# Patient Record
Sex: Male | Born: 1980 | Race: White | Hispanic: No | Marital: Single | Smoking: Current every day smoker
Health system: Southern US, Community
[De-identification: ages and names within clinical notes are randomized; demographics above are authoritative.]

## PROBLEM LIST (undated history)

## (undated) DIAGNOSIS — I10 Essential (primary) hypertension: Secondary | ICD-10-CM

## (undated) HISTORY — DX: Essential (primary) hypertension: I10

---
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
## 2003-11-06 ENCOUNTER — Other Ambulatory Visit: Payer: Self-pay

## 2005-02-07 ENCOUNTER — Emergency Department: Payer: Self-pay | Admitting: Emergency Medicine

## 2008-06-23 ENCOUNTER — Emergency Department: Payer: Self-pay | Admitting: Emergency Medicine

## 2009-07-01 ENCOUNTER — Emergency Department: Payer: Self-pay | Admitting: Emergency Medicine

## 2010-01-20 ENCOUNTER — Emergency Department: Payer: Self-pay | Admitting: Unknown Physician Specialty

## 2010-01-26 ENCOUNTER — Emergency Department: Payer: Self-pay | Admitting: Emergency Medicine

## 2010-02-18 ENCOUNTER — Emergency Department: Payer: Self-pay | Admitting: Emergency Medicine

## 2011-03-22 IMAGING — CT CT ABD-PELV W/O CM
1 of 2 series · 15 of 32 positions shown, 19 images · non-contrast
Comparison: none

REASON FOR EXAM: (1) left flank pain; (2) left flank pain
COMMENTS:   May transport without cardiac monitor

[Series 2: stone · axial · 0.93mm/px · z∈[-978,-510]mm · 15 of 170 slices shown, 19 images]
[im 7/170  soft-tissue]
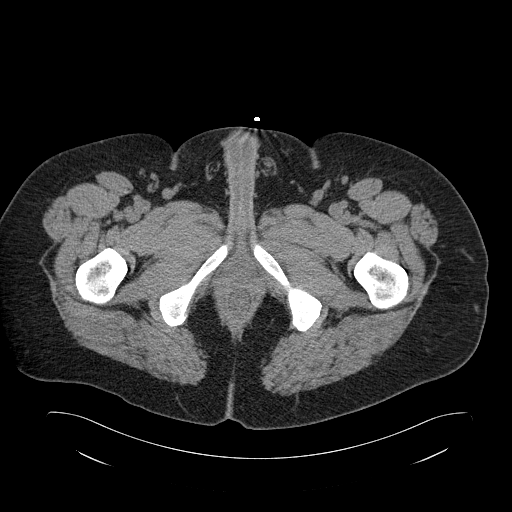
[im 7/170  bone]
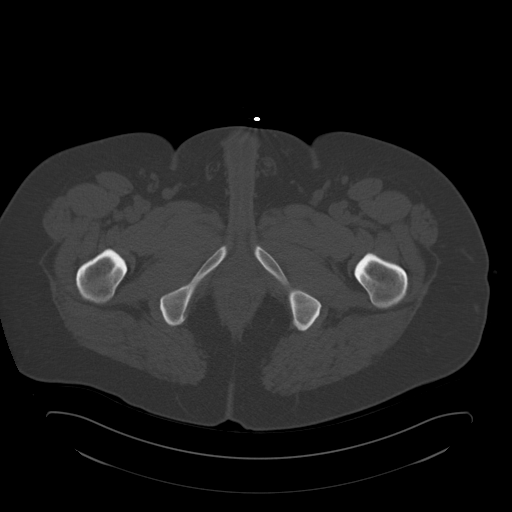
[im 21/170  soft-tissue]
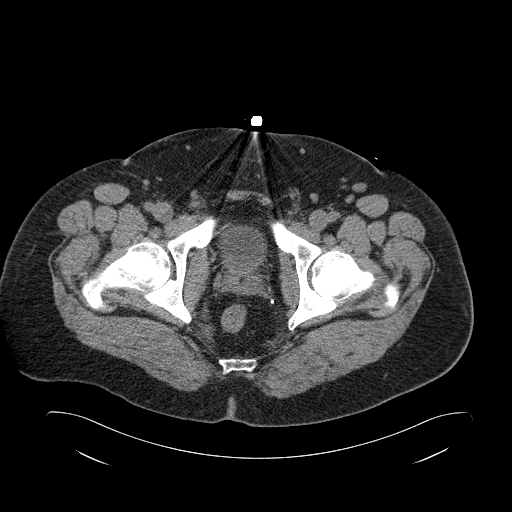
[im 34/170  soft-tissue]
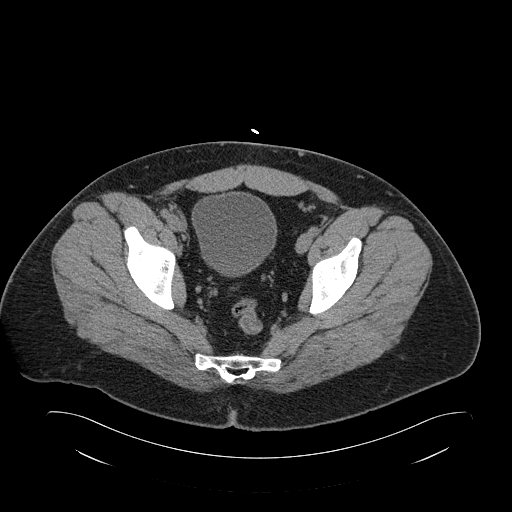
[im 48/170  soft-tissue]
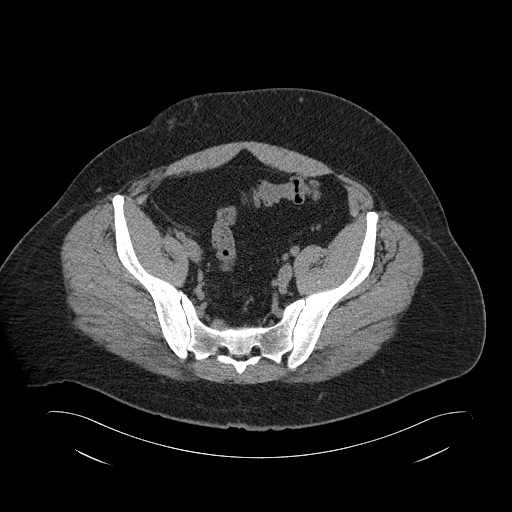
[im 61/170  soft-tissue]
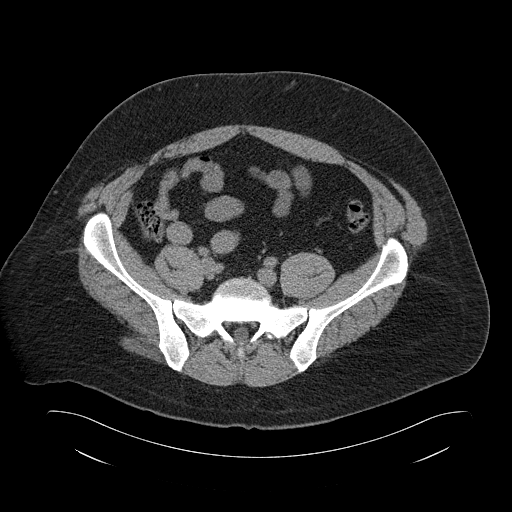
[im 75/170  soft-tissue]
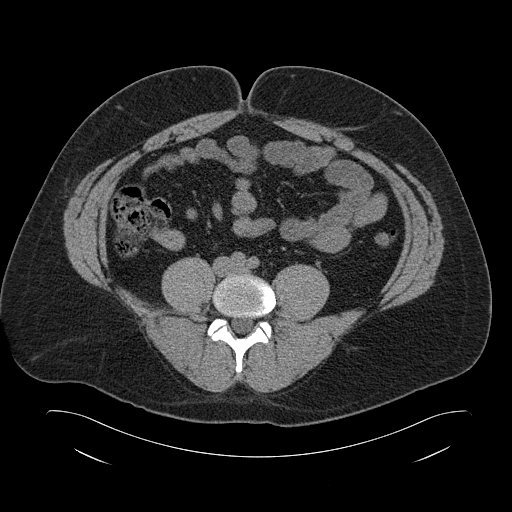
[im 88/170  soft-tissue]
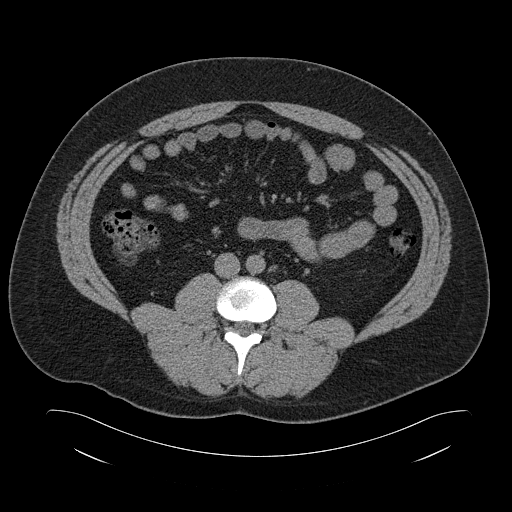
[im 95/170  soft-tissue]
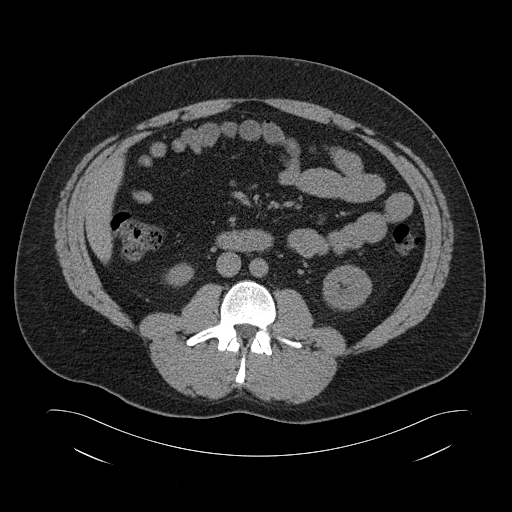
[im 109/170  soft-tissue]
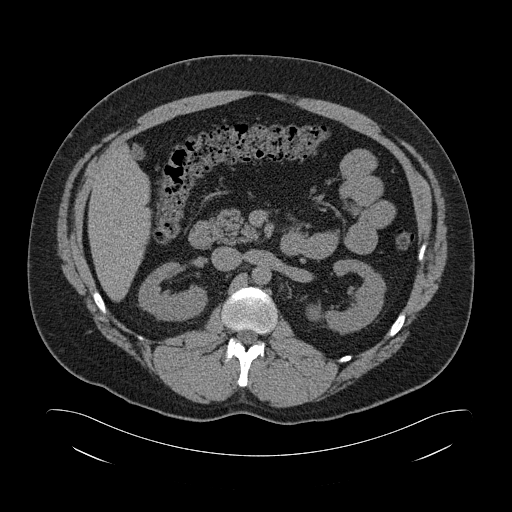
[im 109/170  bone]
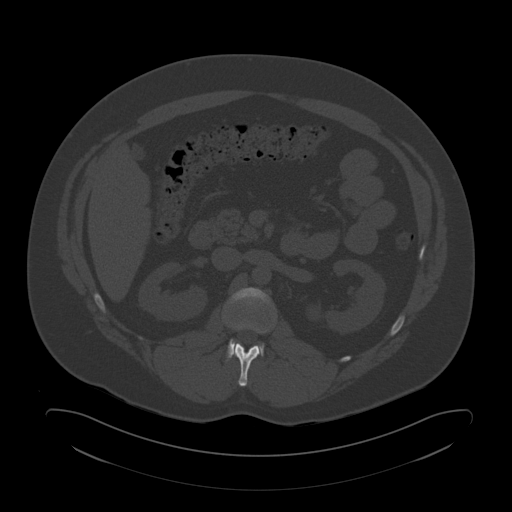
[im 122/170  soft-tissue]
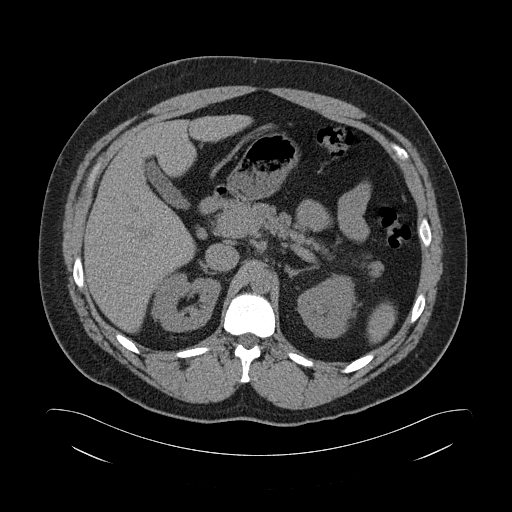
[im 136/170  soft-tissue]
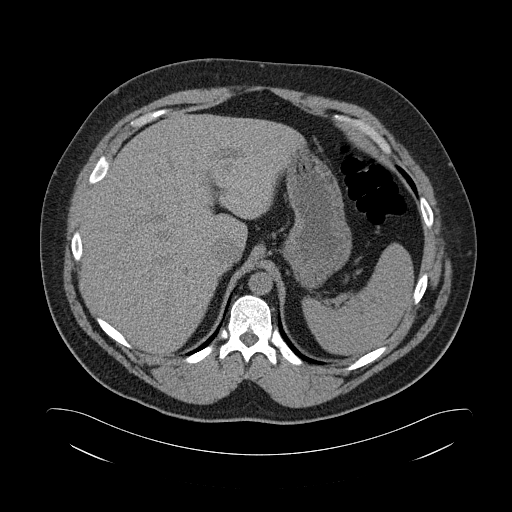
[im 142/170  lung]
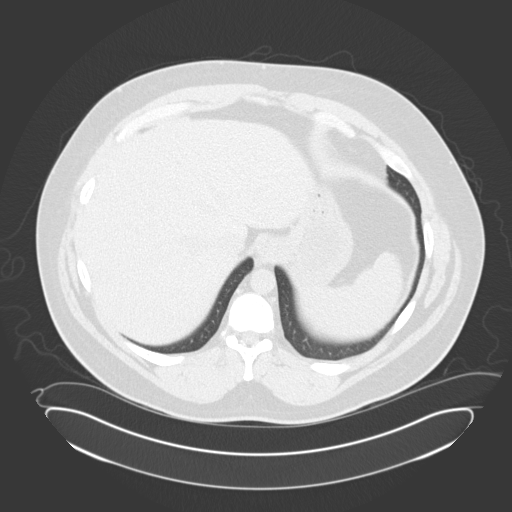
[im 149/170  soft-tissue]
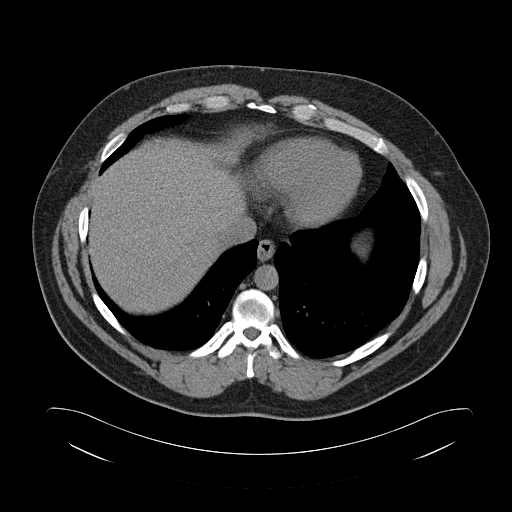
[im 149/170  lung]
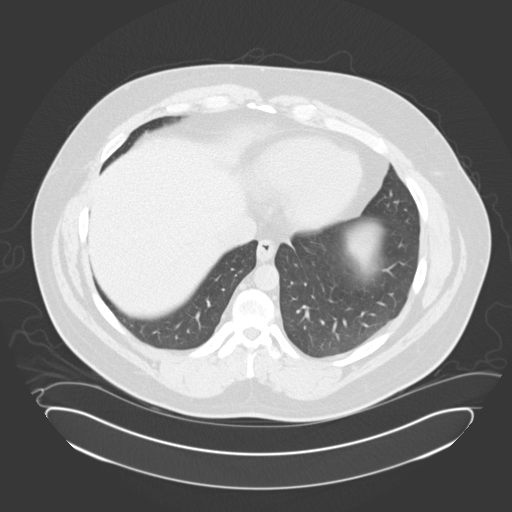
[im 156/170  lung]
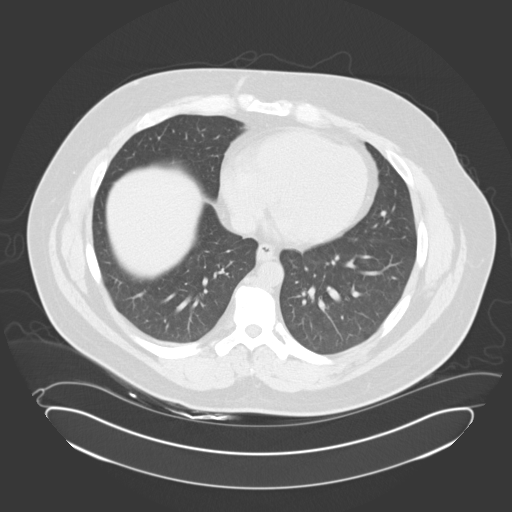
[im 163/170  soft-tissue]
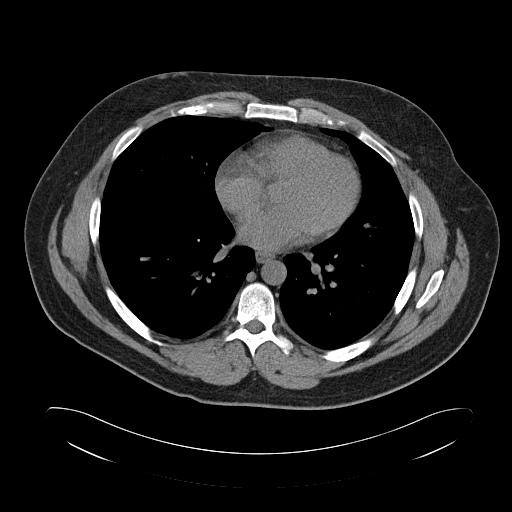
[im 163/170  lung]
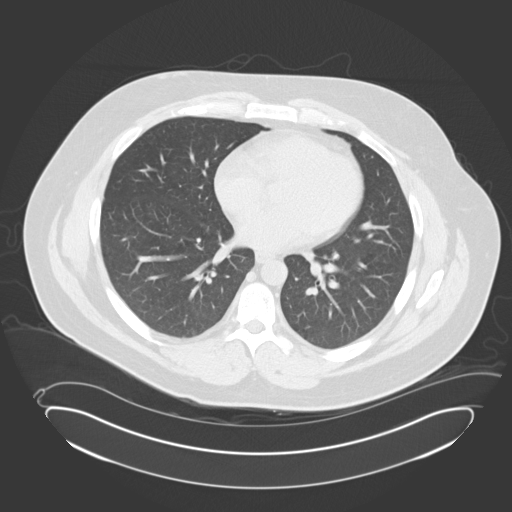

[15 of 32 positions shown; findings below may reference images not displayed]

PROCEDURE:     CT  - CT ABDOMEN AND PELVIS W[DATE]  [DATE]

RESULT:     Axial noncontrast CT scanning was performed through the abdomen
and pelvis at 3 mm intervals and slice thicknesses. Review of multiplanar
reconstructed images was performed separately on the VIA monitor.

The kidneys exhibit normal density and contour with no evidence of stones
nor of obstruction. The perinephric fat is normal in density. The liver,
gallbladder, pancreas, spleen, partially distended stomach, and adrenal
glands are normal in appearance. The caliber of the abdominal aorta is
normal.

The unopacified loops of small and large bowel exhibit no evidence of ileus
or obstruction or acute inflammation. The partially distended urinary
bladder is normal in appearance. I see no inguinal nor umbilical hernia. The
lung bases are clear. The lumbar vertebral bodies are preserved in height.
IMPRESSION: 1. I do not see evidence of urinary tract stones or urinary tract
obstruction or other acute abnormality of the kidneys.
2. I do not see evidence of bowel obstruction or ileus or inflammation.
3. There is no evidence of acute hepatobiliary abnormality.
4. I do not see evidence of inguinal nor umbilical hernia is.

A preliminary report was sent to the [HOSPITAL] the conclusion
of the study.

## 2011-04-20 IMAGING — US ABDOMEN ULTRASOUND
1 series · 17 of 25 positions shown · non-contrast
Comparison: none

REASON FOR EXAM: LUQ abd pain, nausea
COMMENTS:

[Series 1: abdomen ultrasound · 17 of 81 slices shown]
[im 1/81]
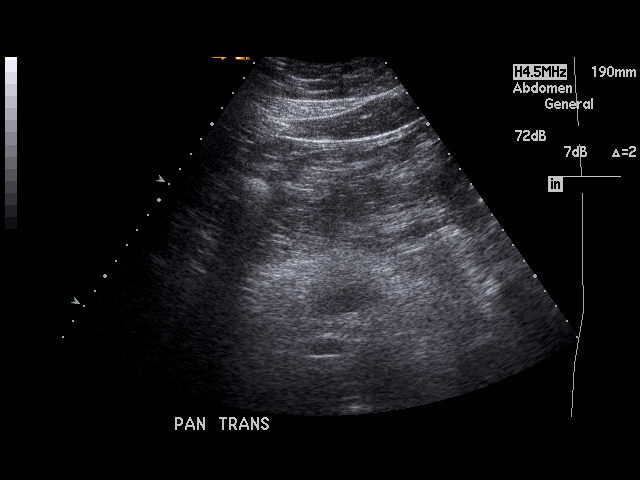
[im 7/81]
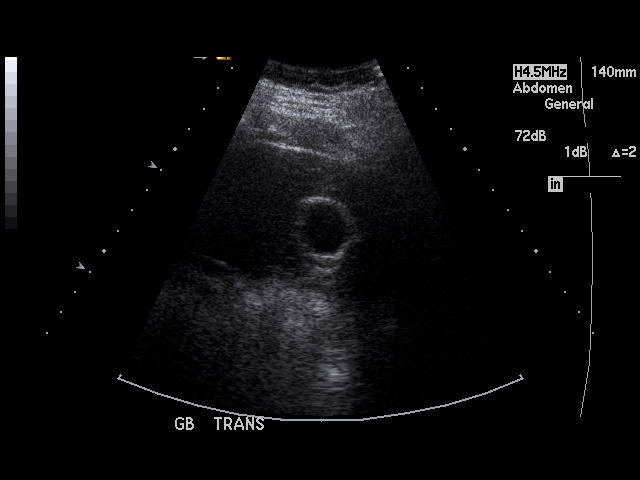
[im 11/81]
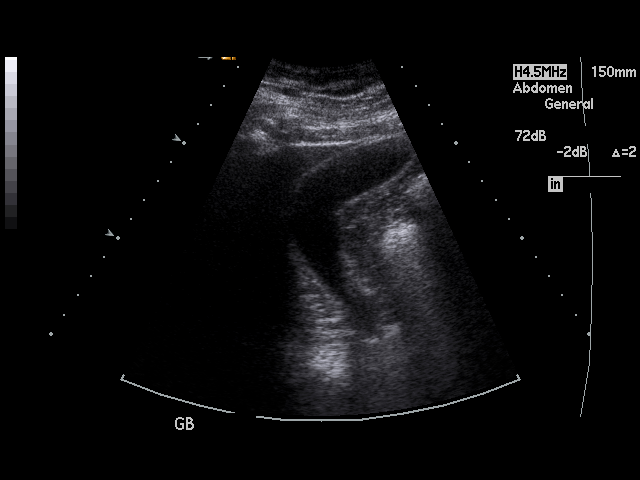
[im 17/81]
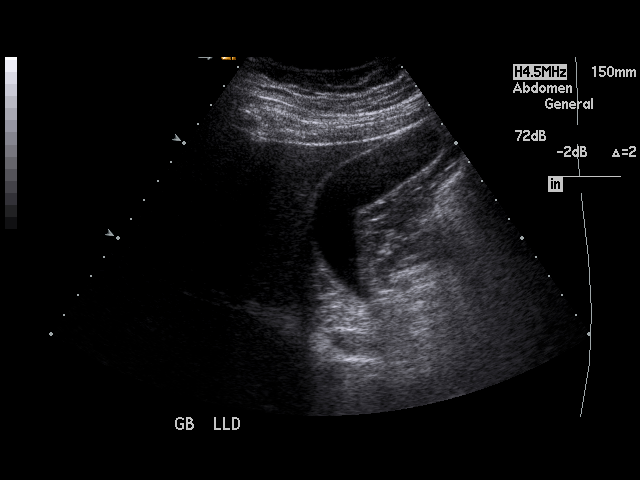
[im 21/81]
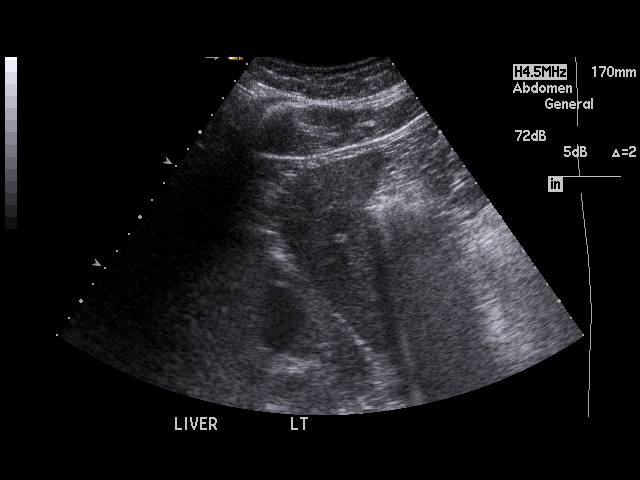
[im 27/81]
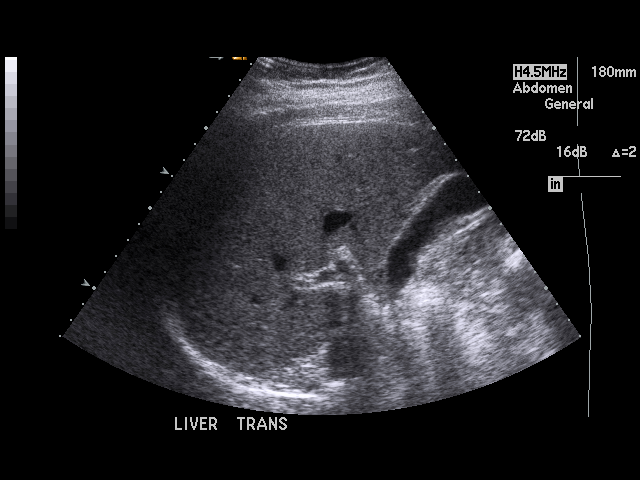
[im 31/81]
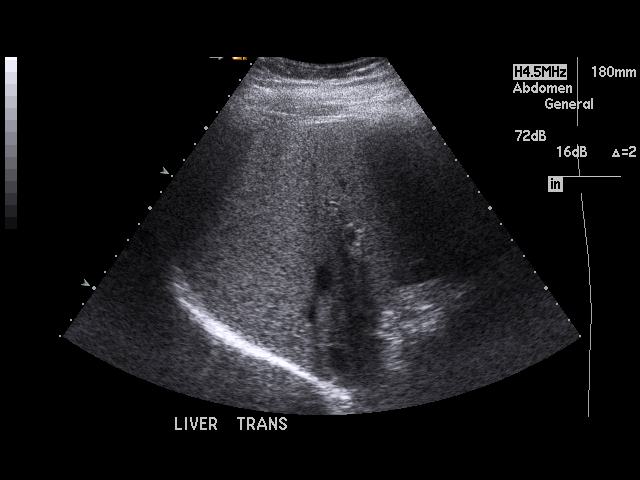
[im 37/81]
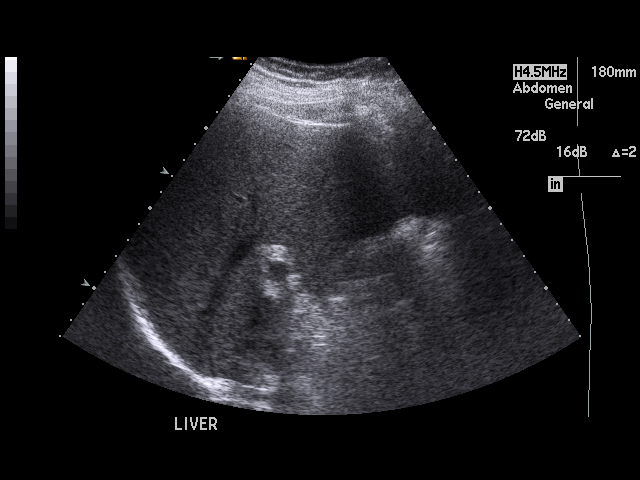
[im 41/81]
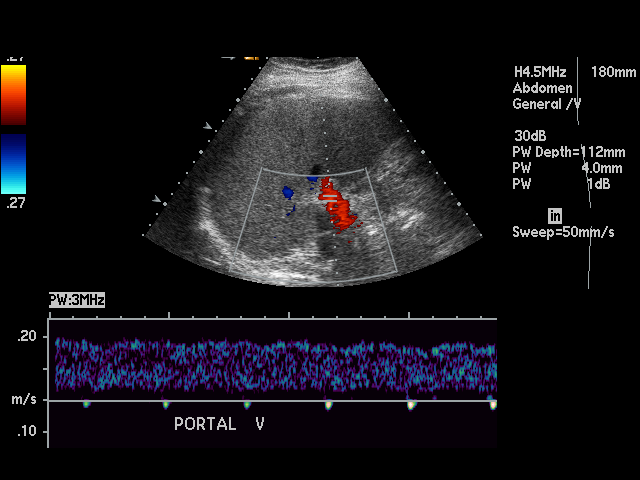
[im 44/81]
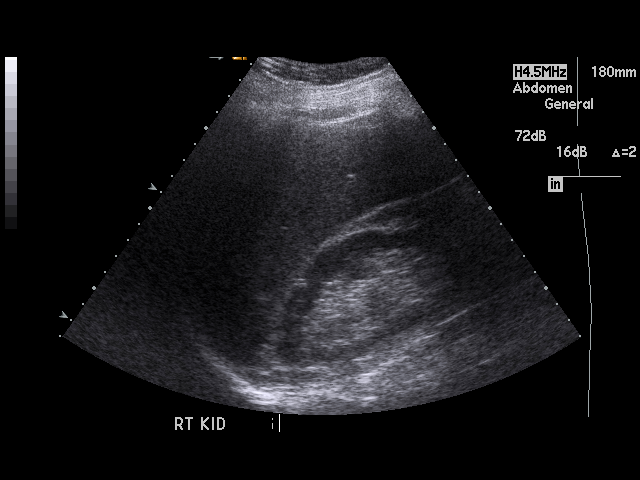
[im 51/81]
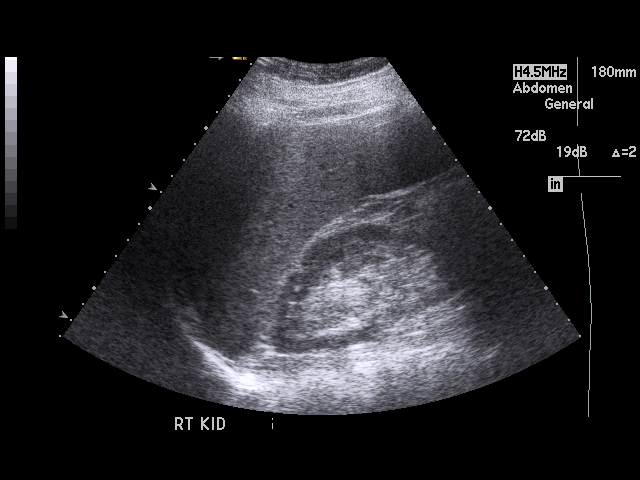
[im 54/81]
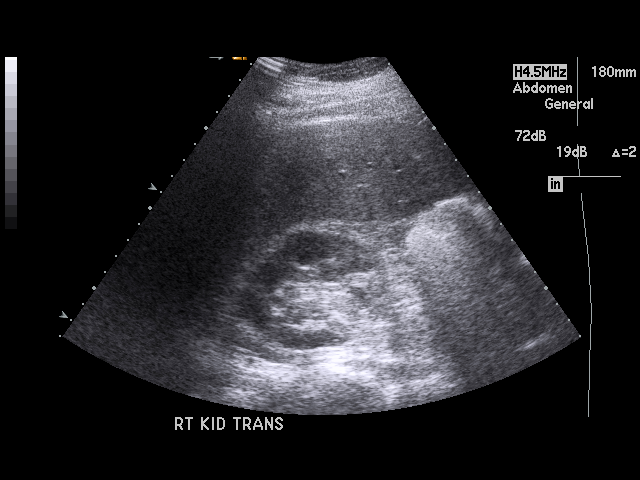
[im 61/81]
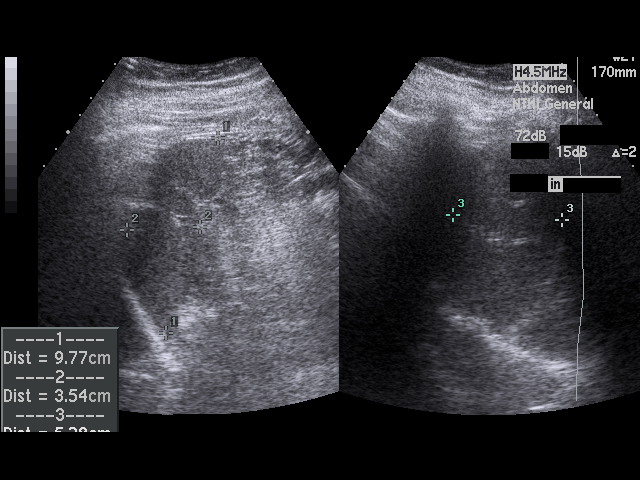
[im 64/81]
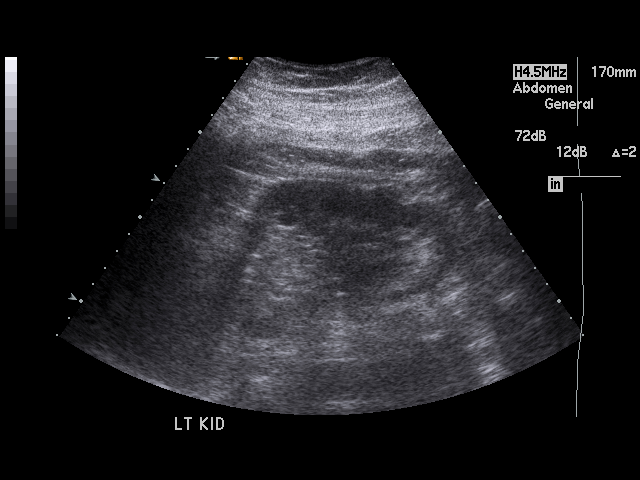
[im 71/81]
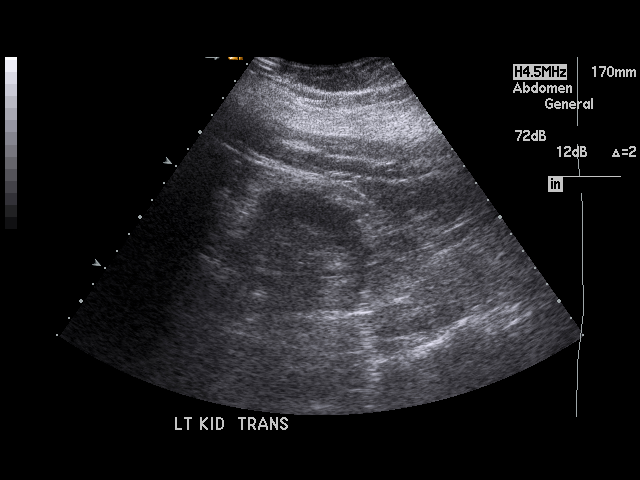
[im 74/81]
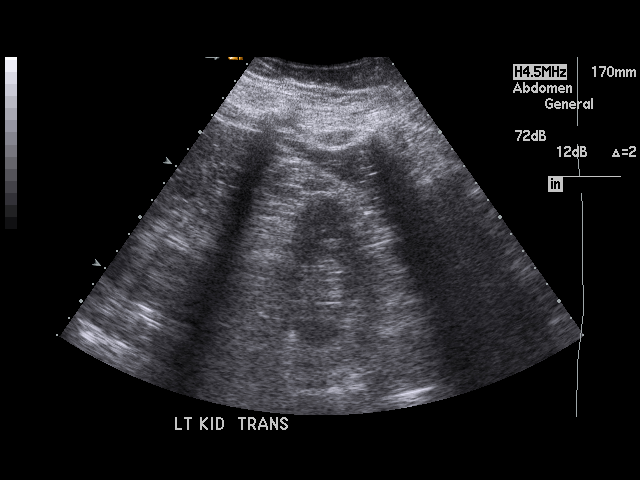
[im 81/81]
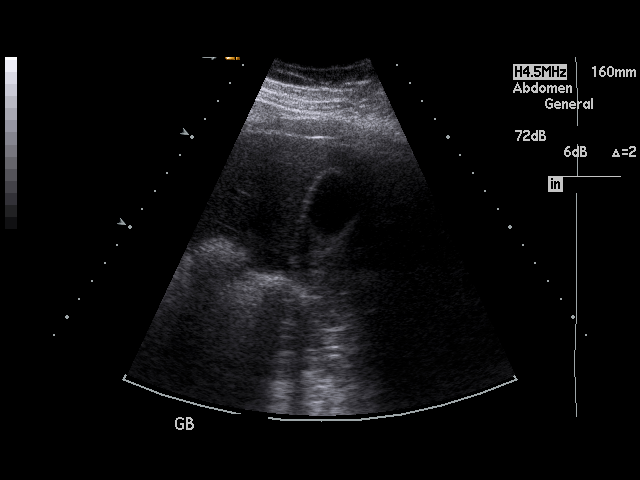

[17 of 25 positions shown; findings below may reference images not displayed]

PROCEDURE:     US  - US ABDOMEN GENERAL SURVEY  - February 18, 2010  [DATE]

RESULT:     The liver, spleen, abdominal aorta and inferior vena cava show
no significant abnormalities. The pancreas is not visualized adequately for
evaluation on this exam. No gallstones are seen. There is no thickening of
the gallbladder wall. The common bile duct measures 4.1 mm in diameter which
is within normal limits. The kidneys show no hydronephrosis. Sagittally, the
right kidney measures 11.76 cm and the left kidney measures 11.61 cm. No
ascites is seen.
IMPRESSION: 1. No significant abnormalities are noted.
2. The pancreas is not visualized adequately for evaluation on this exam.

## 2019-08-29 ENCOUNTER — Ambulatory Visit: Payer: Medicaid Other | Attending: Internal Medicine

## 2019-08-29 DIAGNOSIS — Z23 Encounter for immunization: Secondary | ICD-10-CM

## 2019-08-29 NOTE — Progress Notes (Signed)
   Covid-19 Vaccination Clinic  Name:  Christopher Edwards    MRN: [id number] DOB: [birth year]  08/29/2019  Christopher Edwards was observed post Covid-19 immunization for 15 minutes without incident. He was provided with Vaccine Information Sheet and instruction to access the V-Safe system.   Christopher Edwards was instructed to call 911 with any severe reactions post vaccine: Marland Kitchen Difficulty breathing  . Swelling of face and throat  . A fast heartbeat  . A bad rash all over body  . Dizziness and weakness   Immunizations Administered    Name Date Dose VIS Date Route   Pfizer COVID-19 Vaccine 08/29/2019 10:36 AM 0.3 mL 06/26/2018 Intramuscular   Manufacturer: [employer], [employer]   Lot: [id number]   NDC: [address]-4

## 2019-09-24 ENCOUNTER — Ambulatory Visit: Payer: Medicaid Other | Attending: Internal Medicine

## 2019-09-24 DIAGNOSIS — Z23 Encounter for immunization: Secondary | ICD-10-CM

## 2019-09-24 NOTE — Progress Notes (Signed)
   Covid-19 Vaccination Clinic  Name:  Christopher Edwards    MRN: [id number] DOB: 04-04-1981  09/24/2019  Mr. Christopher Edwards was observed post Covid-19 immunization for 15 minutes without incident. He was provided with Vaccine Information Sheet and instruction to access the V-Safe system.   Mr. Christopher Edwards was instructed to call 911 with any severe reactions post vaccine: Marland Kitchen Difficulty breathing  . Swelling of face and throat  . A fast heartbeat  . A bad rash all over body  . Dizziness and weakness   Immunizations Administered    Name Date Dose VIS Date Route   Pfizer COVID-19 Vaccine 09/24/2019 10:30 AM 0.3 mL 06/26/2018 Intramuscular   Manufacturer: [employer], [employer]   Lot: [id number]   NDC: [address]-0

## 2022-11-11 ENCOUNTER — Ambulatory Visit: Payer: Medicaid Other | Admitting: Family

## 2022-11-11 VITALS — BP 130/71 | HR 101 | Ht 72.0 in | Wt 325.8 lb

## 2022-11-11 DIAGNOSIS — E559 Vitamin D deficiency, unspecified: Secondary | ICD-10-CM

## 2022-11-11 DIAGNOSIS — R5383 Other fatigue: Secondary | ICD-10-CM

## 2022-11-11 DIAGNOSIS — R7303 Prediabetes: Secondary | ICD-10-CM | POA: Diagnosis not present

## 2022-11-11 DIAGNOSIS — I10 Essential (primary) hypertension: Secondary | ICD-10-CM

## 2022-11-11 DIAGNOSIS — Z6841 Body Mass Index (BMI) 40.0 and over, adult: Secondary | ICD-10-CM

## 2022-11-11 DIAGNOSIS — F5101 Primary insomnia: Secondary | ICD-10-CM

## 2022-11-11 DIAGNOSIS — E538 Deficiency of other specified B group vitamins: Secondary | ICD-10-CM

## 2022-11-11 DIAGNOSIS — E782 Mixed hyperlipidemia: Secondary | ICD-10-CM

## 2022-11-11 MED ORDER — ZALEPLON 10 MG PO CAPS: 10.0000 mg | ORAL_CAPSULE | Freq: Every evening | ORAL | 0 refills | Status: DC | PRN

## 2022-11-11 NOTE — Progress Notes (Signed)
New Patient Office Visit  Subjective    Patient ID: Christopher Edwards, male    DOB: [birth year]  Age: 42 y.o. MRN: [id number]  CC:  Chief Complaint  Patient presents with   Establish Care    New patient    HPI Christopher Edwards presents to establish care Previous Primary Care provider/office:  n/a - hasn't had one since 2006.   he does have additional concerns to discuss today.   Patient has had issues with sleeping, asks if there is something that can help with this.  He also asks if there is a way to help him lose weight and stop smoking.       Outpatient Encounter Medications as of 11/11/2022  Medication Sig   [DISCONTINUED] zaleplon (SONATA) 10 MG capsule Take 1 capsule (10 mg total) by mouth at bedtime as needed for sleep. (Patient not taking: Reported on 11/25/2022)   No facility-administered encounter medications on file as of 11/11/2022.    Past Medical History:  Diagnosis Date   Hypertension     Past Surgical History:  Procedure Laterality Date   APPENDECTOMY  1984   TONSILLECTOMY  1989    Family History  Problem Relation Age of Onset   Cancer Mother 65       liver   Alcoholism Father    Cirrhosis Father    Other Sister        Shunt in brain    Social History   Socioeconomic History   Marital status: Single    Spouse name: Not on file   Number of children: Not on file   Years of education: Not on file   Highest education level: Not on file  Occupational History   Not on file  Tobacco Use   Smoking status: Every Day    Current packs/day: 2.00    Average packs/day: 2.0 packs/day for 25.0 years (50.0 ttl pk-yrs)    Types: Cigarettes   Smokeless tobacco: Never  Substance and Sexual Activity   Alcohol use: Not Currently   Drug use: Not Currently    Types: Amphetamines    Comment: stopped actively using 2 years ago   Sexual activity: Yes  Other Topics Concern   Not on file  Social History Narrative   Not on file   Social Determinants of Health    Financial Resource Strain: Not on file  Food Insecurity: Not on file  Transportation Needs: Not on file  Physical Activity: Not on file  Stress: Not on file  Social Connections: Not on file  Intimate Partner Violence: Not on file    Review of Systems  Psychiatric/Behavioral:  The patient has insomnia.   All other systems reviewed and are negative.      Objective    BP 130/71   Pulse (!) 101   Ht 6' (1.829 m)   Wt (!) 325 lb 12.8 oz (147.8 kg)   SpO2 98%   BMI 44.19 kg/m   Physical Exam Vitals and nursing note reviewed.  Constitutional:      Appearance: Normal appearance. He is normal weight.  Eyes:     Extraocular Movements: Extraocular movements intact.     Conjunctiva/sclera: Conjunctivae normal.     Pupils: Pupils are equal, round, and reactive to light.  Cardiovascular:     Rate and Rhythm: Normal rate and regular rhythm.     Pulses: Normal pulses.     Heart sounds: Normal heart sounds.  Pulmonary:     Effort: Pulmonary  effort is normal.     Breath sounds: Normal breath sounds.  Musculoskeletal:        General: Normal range of motion.  Neurological:     General: No focal deficit present.     Mental Status: He is alert and oriented to person, place, and time. Mental status is at baseline.  Psychiatric:        Mood and Affect: Mood normal.        Behavior: Behavior normal.        Thought Content: Thought content normal.        Judgment: Judgment normal.        Assessment & Plan:   Problem List Items Addressed This Visit       Active Problems   Hypertension - Primary   Class 3 severe obesity due to excess calories with serious comorbidity and body mass index (BMI) of 40.0 to 44.9 in adult [facility])   Primary insomnia   Mixed hyperlipidemia   Relevant Orders   Lipid panel (Completed)   CBC With Differential (Completed)   CMP14+EGFR (Completed)   Other Visit Diagnoses     Prediabetes       Checking A1C today.  Reinforced changes to diet and  lifestyle.   Relevant Orders   CBC With Differential (Completed)   CMP14+EGFR (Completed)   Hemoglobin A1c (Completed)   B12 deficiency due to diet       Checking labs today.  Will supplement as needed.   Relevant Orders   CBC With Differential (Completed)   CMP14+EGFR (Completed)   Vitamin B12 (Completed)   Vitamin D deficiency, unspecified       Checking labs today Will supplement as needed   Relevant Orders   VITAMIN D 25 Hydroxy (Vit-D Deficiency, Fractures) (Completed)   CBC With Differential (Completed)   CMP14+EGFR (Completed)   Other fatigue       Relevant Orders   CBC With Differential (Completed)   CMP14+EGFR (Completed)   TSH (Completed)       Return in about 2 weeks (around 11/25/2022) for F/U.   Total time spent: 30 minutes  Christopher Kins, FNP  11/11/2022  This document may have been prepared by [facility] Voice Recognition software and as such may include unintentional dictation errors.

## 2022-11-12 LAB — CBC WITH DIFFERENTIAL
Basophils Absolute: 0.1 10*3/uL (ref 0.0–0.2)
Basos: 0 %
EOS (ABSOLUTE): 0.4 10*3/uL (ref 0.0–0.4)
Eos: 3 %
Hematocrit: 47 % (ref 37.5–51.0)
Hemoglobin: 16.7 g/dL (ref 13.0–17.7)
Immature Grans (Abs): 0 10*3/uL (ref 0.0–0.1)
Immature Granulocytes: 0 %
Lymphocytes Absolute: 3.8 10*3/uL — ABNORMAL HIGH (ref 0.7–3.1)
Lymphs: 34 %
MCH: 32.8 pg (ref 26.6–33.0)
MCHC: 35.5 g/dL (ref 31.5–35.7)
MCV: 92 fL (ref 79–97)
Monocytes Absolute: 0.9 10*3/uL (ref 0.1–0.9)
Monocytes: 8 %
Neutrophils Absolute: 6.1 10*3/uL (ref 1.4–7.0)
Neutrophils: 55 %
RBC: 5.09 x10E6/uL (ref 4.14–5.80)
RDW: 12.8 % (ref 11.6–15.4)
WBC: 11.2 10*3/uL — ABNORMAL HIGH (ref 3.4–10.8)

## 2022-11-12 LAB — CMP14+EGFR
ALT: 27 IU/L (ref 0–44)
AST: 12 IU/L (ref 0–40)
Albumin: 4.2 g/dL (ref 4.1–5.1)
Alkaline Phosphatase: 109 IU/L (ref 44–121)
BUN/Creatinine Ratio: 12 (ref 9–20)
BUN: 10 mg/dL (ref 6–24)
Bilirubin Total: 0.2 mg/dL (ref 0.0–1.2)
CO2: 24 mmol/L (ref 20–29)
Calcium: 9.3 mg/dL (ref 8.7–10.2)
Chloride: 102 mmol/L (ref 96–106)
Creatinine, Ser: 0.83 mg/dL (ref 0.76–1.27)
Globulin, Total: 2.5 g/dL (ref 1.5–4.5)
Glucose: 89 mg/dL (ref 70–99)
Potassium: 4.4 mmol/L (ref 3.5–5.2)
Sodium: 142 mmol/L (ref 134–144)
Total Protein: 6.7 g/dL (ref 6.0–8.5)
eGFR: 111 mL/min/{1.73_m2} (ref >59)

## 2022-11-12 LAB — LIPID PANEL
Chol/HDL Ratio: 4.7 ratio (ref 0.0–5.0)
Cholesterol, Total: 180 mg/dL (ref 100–199)
HDL: 38 mg/dL — ABNORMAL LOW (ref >39)
LDL Chol Calc (NIH): 108 mg/dL — ABNORMAL HIGH (ref 0–99)
Triglycerides: 192 mg/dL — ABNORMAL HIGH (ref 0–149)
VLDL Cholesterol Cal: 34 mg/dL (ref 5–40)

## 2022-11-12 LAB — HEMOGLOBIN A1C
Est. average glucose Bld gHb Est-mCnc: 111 mg/dL
Hgb A1c MFr Bld: 5.5 % (ref 4.8–5.6)

## 2022-11-12 LAB — VITAMIN D 25 HYDROXY (VIT D DEFICIENCY, FRACTURES): Vit D, 25-Hydroxy: 30.9 ng/mL (ref 30.0–100.0)

## 2022-11-12 LAB — VITAMIN B12: Vitamin B-12: 481 pg/mL (ref 232–1245)

## 2022-11-12 LAB — TSH: TSH: 0.833 u[IU]/mL (ref 0.450–4.500)

## 2022-11-25 ENCOUNTER — Encounter: Payer: Self-pay | Admitting: Family

## 2022-11-25 ENCOUNTER — Ambulatory Visit: Payer: Medicaid Other | Admitting: Family

## 2022-11-25 VITALS — BP 120/85 | HR 94 | Ht 72.0 in | Wt 329.2 lb

## 2022-11-25 DIAGNOSIS — I10 Essential (primary) hypertension: Secondary | ICD-10-CM

## 2022-11-25 DIAGNOSIS — R9431 Abnormal electrocardiogram [ECG] [EKG]: Secondary | ICD-10-CM

## 2022-11-25 DIAGNOSIS — E782 Mixed hyperlipidemia: Secondary | ICD-10-CM | POA: Diagnosis not present

## 2022-11-25 DIAGNOSIS — Z6841 Body Mass Index (BMI) 40.0 and over, adult: Secondary | ICD-10-CM

## 2022-11-25 DIAGNOSIS — F5101 Primary insomnia: Secondary | ICD-10-CM | POA: Insufficient documentation

## 2022-11-25 MED ORDER — BACLOFEN 10 MG PO TABS: 10.0000 mg | ORAL_TABLET | Freq: Every day | ORAL | 1 refills | Status: DC

## 2022-11-25 MED ORDER — PREDNISONE 20 MG PO TABS: 20.0000 mg | ORAL_TABLET | Freq: Every day | ORAL | 0 refills | Status: DC

## 2022-11-25 MED ORDER — ESZOPICLONE 2 MG PO TABS: 2.0000 mg | ORAL_TABLET | Freq: Every evening | ORAL | 0 refills | Status: DC | PRN

## 2022-11-25 MED ORDER — CLINDAMYCIN HCL 300 MG PO CAPS: 300.0000 mg | ORAL_CAPSULE | Freq: Three times a day (TID) | ORAL | 0 refills | Status: DC
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
## 2022-12-01 ENCOUNTER — Other Ambulatory Visit: Payer: Self-pay

## 2022-12-01 DIAGNOSIS — R9431 Abnormal electrocardiogram [ECG] [EKG]: Secondary | ICD-10-CM

## 2022-12-04 ENCOUNTER — Encounter: Payer: Self-pay | Admitting: Family

## 2022-12-04 NOTE — Assessment & Plan Note (Signed)
Starting pt. On Wegovy.  Follow up in 1 month.  Patient given wegovy samples.   Will recheck at follow up.

## 2022-12-04 NOTE — Assessment & Plan Note (Signed)
Blood pressure well controlled with current medications.  Continue current therapy.  Will reassess at follow up.  

## 2022-12-04 NOTE — Assessment & Plan Note (Signed)
Discussed dietary and lifestyle modifications for lipid control. Patient agrees to try these and we will recheck in 3 months.  Will modify as needed based on labwork results.

## 2022-12-09 ENCOUNTER — Encounter: Payer: Self-pay | Admitting: Family

## 2022-12-09 ENCOUNTER — Ambulatory Visit: Payer: Medicaid Other | Admitting: Family

## 2022-12-09 VITALS — BP 125/80 | HR 105 | Ht 72.0 in | Wt 322.2 lb

## 2022-12-09 DIAGNOSIS — F5101 Primary insomnia: Secondary | ICD-10-CM

## 2022-12-09 DIAGNOSIS — R799 Abnormal finding of blood chemistry, unspecified: Secondary | ICD-10-CM | POA: Diagnosis not present

## 2022-12-09 DIAGNOSIS — Z6841 Body Mass Index (BMI) 40.0 and over, adult: Secondary | ICD-10-CM

## 2022-12-09 DIAGNOSIS — I10 Essential (primary) hypertension: Secondary | ICD-10-CM

## 2022-12-09 LAB — CBC WITH DIFFERENTIAL/PLATELET
Basophils Absolute: 0.1 10*3/uL (ref 0.0–0.2)
Basos: 0 %
EOS (ABSOLUTE): 0.3 10*3/uL (ref 0.0–0.4)
Eos: 3 %
Hematocrit: 47.4 % (ref 37.5–51.0)
Hemoglobin: 16.5 g/dL (ref 13.0–17.7)
Immature Grans (Abs): 0 10*3/uL (ref 0.0–0.1)
Immature Granulocytes: 0 %
Lymphocytes Absolute: 3.8 10*3/uL — ABNORMAL HIGH (ref 0.7–3.1)
Lymphs: 33 %
MCH: 31.9 pg (ref 26.6–33.0)
MCHC: 34.8 g/dL (ref 31.5–35.7)
MCV: 92 fL (ref 79–97)
Monocytes Absolute: 0.7 10*3/uL (ref 0.1–0.9)
Monocytes: 7 %
Neutrophils Absolute: 6.5 10*3/uL (ref 1.4–7.0)
Neutrophils: 57 %
Platelets: 295 10*3/uL (ref 150–450)
RBC: 5.17 x10E6/uL (ref 4.14–5.80)
RDW: 12.9 % (ref 11.6–15.4)
WBC: 11.3 10*3/uL — ABNORMAL HIGH (ref 3.4–10.8)

## 2022-12-09 MED ORDER — SEMAGLUTIDE-WEIGHT MANAGEMENT 0.5 MG/0.5ML ~~LOC~~ SOAJ: 0.5000 mg | SUBCUTANEOUS | 0 refills | Status: DC

## 2022-12-09 MED ORDER — BUPROPION HCL ER (SR) 150 MG PO TB12: 150.0000 mg | ORAL_TABLET | Freq: Two times a day (BID) | ORAL | 0 refills | Status: DC

## 2022-12-09 MED ORDER — SEMAGLUTIDE-WEIGHT MANAGEMENT 2.4 MG/0.75ML ~~LOC~~ SOAJ: 2.4000 mg | SUBCUTANEOUS | 1 refills | Status: DC

## 2022-12-09 MED ORDER — SEMAGLUTIDE-WEIGHT MANAGEMENT 1.7 MG/0.75ML ~~LOC~~ SOAJ: 1.7000 mg | SUBCUTANEOUS | 0 refills | Status: DC

## 2022-12-09 MED ORDER — SEMAGLUTIDE-WEIGHT MANAGEMENT 1 MG/0.5ML ~~LOC~~ SOAJ: 1.0000 mg | SUBCUTANEOUS | 0 refills | Status: DC

## 2022-12-12 ENCOUNTER — Encounter: Payer: Self-pay | Admitting: Family

## 2022-12-12 ENCOUNTER — Other Ambulatory Visit (INDEPENDENT_AMBULATORY_CARE_PROVIDER_SITE_OTHER): Payer: Medicaid Other

## 2022-12-12 ENCOUNTER — Ambulatory Visit: Payer: Medicaid Other

## 2022-12-12 DIAGNOSIS — R799 Abnormal finding of blood chemistry, unspecified: Secondary | ICD-10-CM | POA: Diagnosis not present

## 2022-12-12 DIAGNOSIS — R9431 Abnormal electrocardiogram [ECG] [EKG]: Secondary | ICD-10-CM

## 2022-12-12 LAB — POCT URINALYSIS DIPSTICK
Bilirubin, UA: NEGATIVE
Blood, UA: NEGATIVE
Glucose, UA: NEGATIVE
Ketones, UA: NEGATIVE
Leukocytes, UA: NEGATIVE
Nitrite, UA: NEGATIVE
Protein, UA: NEGATIVE
Spec Grav, UA: 1.015 (ref 1.010–1.025)
Urobilinogen, UA: 0.2 E.U./dL
pH, UA: 6 (ref 5.0–8.0)

## 2022-12-19 ENCOUNTER — Other Ambulatory Visit: Payer: Self-pay

## 2022-12-19 ENCOUNTER — Other Ambulatory Visit: Payer: Self-pay | Admitting: Family

## 2022-12-19 DIAGNOSIS — R0600 Dyspnea, unspecified: Secondary | ICD-10-CM

## 2022-12-19 MED ORDER — SEMAGLUTIDE(0.25 OR 0.5MG/DOS) 2 MG/3ML ~~LOC~~ SOPN
0.5000 mg | PEN_INJECTOR | SUBCUTANEOUS | 1 refills | Status: DC
Start: 2022-12-19 — End: 2023-02-13
  Filled 2022-12-19 – 2022-12-23 (×2): qty 3, 28d supply, fill #0

## 2022-12-20 ENCOUNTER — Other Ambulatory Visit: Payer: Self-pay

## 2022-12-23 ENCOUNTER — Other Ambulatory Visit: Payer: Self-pay

## 2022-12-26 ENCOUNTER — Other Ambulatory Visit: Payer: Self-pay

## 2022-12-26 ENCOUNTER — Other Ambulatory Visit: Payer: Self-pay | Admitting: Family

## 2022-12-26 MED ORDER — WEGOVY 0.5 MG/0.5ML ~~LOC~~ SOAJ
0.5000 mg | SUBCUTANEOUS | 1 refills | Status: DC
Start: 2022-12-26 — End: 2023-01-24
  Filled 2022-12-26: qty 2, 28d supply, fill #0

## 2022-12-27 ENCOUNTER — Other Ambulatory Visit: Payer: Self-pay

## 2022-12-28 ENCOUNTER — Other Ambulatory Visit: Payer: Self-pay

## 2023-01-01 ENCOUNTER — Encounter: Payer: Self-pay | Admitting: Family

## 2023-01-01 NOTE — Progress Notes (Signed)
Established Patient Office Visit  Subjective:  Patient ID: Christopher Edwards, male    DOB: 26-Nov-1980  Age: 42 y.o. MRN: [id number]  Chief Complaint  Patient presents with   Follow-up    2 week    Patient is here for his 2 week follow up.   He did not get any relief from the insomnia medicine we sent for him to try.  He is willing to keep doing what he had been doing previously for now.   He did have his echo and Dr. Welton Flakes told him that he thought his issues might be more related to his lungs, so he needs a scan for this.   He does want to talk about medication for weight loss, says that he has been doing diet and exercise, but it has not been helpful.    No other concerns at this time.   Past Medical History:  Diagnosis Date   Hypertension     Past Surgical History:  Procedure Laterality Date   APPENDECTOMY  1984   TONSILLECTOMY  1989    Social History   Socioeconomic History   Marital status: Single    Spouse name: Not on file   Number of children: Not on file   Years of education: Not on file   Highest education level: Not on file  Occupational History   Not on file  Tobacco Use   Smoking status: Every Day    Current packs/day: 2.00    Average packs/day: 2.0 packs/day for 25.0 years (50.0 ttl pk-yrs)    Types: Cigarettes   Smokeless tobacco: Never   Tobacco comments:    Bupropion started 12/09/22  Substance and Sexual Activity   Alcohol use: Not Currently   Drug use: Not Currently    Types: Amphetamines    Comment: stopped actively using 2 years ago   Sexual activity: Yes  Other Topics Concern   Not on file  Social History Narrative   Not on file   Social Determinants of Health   Financial Resource Strain: Not on file  Food Insecurity: Not on file  Transportation Needs: Not on file  Physical Activity: Not on file  Stress: Not on file  Social Connections: Not on file  Intimate Partner Violence: Not on file    Family History  Problem Relation Age  of Onset   Cancer Mother 48       liver   Alcoholism Father    Cirrhosis Father    Other Sister        Shunt in brain    No Known Allergies  Review of Systems  All other systems reviewed and are negative.      Objective:   BP 125/80   Pulse (!) 105   Ht 6' (1.829 m)   Wt (!) 322 lb 3.2 oz (146.1 kg)   SpO2 95%   BMI 43.70 kg/m   Vitals:   12/09/22 1007  BP: 125/80  Pulse: (!) 105  Height: 6' (1.829 m)  Weight: (!) 322 lb 3.2 oz (146.1 kg)  SpO2: 95%  BMI (Calculated): 43.69    Physical Exam Vitals and nursing note reviewed.  Constitutional:      Appearance: Normal appearance. He is normal weight.  HENT:     Head: Normocephalic and atraumatic.  Eyes:     Extraocular Movements: Extraocular movements intact.     Conjunctiva/sclera: Conjunctivae normal.     Pupils: Pupils are equal, round, and reactive to light.  Cardiovascular:  Rate and Rhythm: Normal rate and regular rhythm.     Pulses: Normal pulses.     Heart sounds: Normal heart sounds.  Pulmonary:     Effort: Pulmonary effort is normal.     Breath sounds: Normal breath sounds.  Musculoskeletal:        General: Normal range of motion.  Neurological:     General: No focal deficit present.     Mental Status: He is alert and oriented to person, place, and time. Mental status is at baseline.  Psychiatric:        Mood and Affect: Mood normal.        Behavior: Behavior normal.        Thought Content: Thought content normal.        Judgment: Judgment normal.      Results for orders placed or performed in visit on 12/09/22  CBC with Diff  Result Value Ref Range   WBC 11.3 (H) 3.4 - 10.8 x10E3/uL   RBC 5.17 4.14 - 5.80 x10E6/uL   Hemoglobin 16.5 13.0 - 17.7 g/dL   Hematocrit 86.5 78.4 - 51.0 %   MCV 92 79 - 97 fL   MCH 31.9 26.6 - 33.0 pg   MCHC 34.8 31.5 - 35.7 g/dL   RDW 69.6 29.5 - 28.4 %   Platelets 295 150 - 450 x10E3/uL   Neutrophils 57 Not Estab. %   Lymphs 33 Not Estab. %    Monocytes 7 Not Estab. %   Eos 3 Not Estab. %   Basos 0 Not Estab. %   Neutrophils Absolute 6.5 1.4 - 7.0 x10E3/uL   Lymphocytes Absolute 3.8 (H) 0.7 - 3.1 x10E3/uL   Monocytes Absolute 0.7 0.1 - 0.9 x10E3/uL   EOS (ABSOLUTE) 0.3 0.0 - 0.4 x10E3/uL   Basophils Absolute 0.1 0.0 - 0.2 x10E3/uL   Immature Granulocytes 0 Not Estab. %   Immature Grans (Abs) 0.0 0.0 - 0.1 x10E3/uL    Recent Results (from the past 2160 hour(s))  Lipid panel     Status: Abnormal   Collection Time: 11/11/22  1:49 PM  Result Value Ref Range   Cholesterol, Total 180 100 - 199 mg/dL   Triglycerides 132 (H) 0 - 149 mg/dL   HDL 38 (L) >44 mg/dL   VLDL Cholesterol Cal 34 5 - 40 mg/dL   LDL Chol Calc (NIH) 010 (H) 0 - 99 mg/dL   Chol/HDL Ratio 4.7 0.0 - 5.0 ratio    Comment:                                   T. Chol/HDL Ratio                                             Men  Women                               1/2 Avg.Risk  3.4    3.3                                   Avg.Risk  5.0    4.4  2X Avg.Risk  9.6    7.1                                3X Avg.Risk 23.4   11.0   VITAMIN D 25 Hydroxy (Vit-D Deficiency, Fractures)     Status: None   Collection Time: 11/11/22  1:49 PM  Result Value Ref Range   Vit D, 25-Hydroxy 30.9 30.0 - 100.0 ng/mL    Comment: Vitamin D deficiency has been defined by the Institute of Medicine and an Endocrine Society practice guideline as a level of serum 25-OH vitamin D less than 20 ng/mL (1,2). The Endocrine Society went on to further define vitamin D insufficiency as a level between 21 and 29 ng/mL (2). 1. IOM (Institute of Medicine). 2010. Dietary reference    intakes for calcium and D. [place]: The    [employer]. 2. Holick MF, [place], Bischoff-Ferrari HA, et al.    Evaluation, treatment, and prevention of vitamin D    deficiency: an Endocrine Society clinical practice    guideline. JCEM. 2011 Jul; 96(7):1911-30.   CBC With  Differential     Status: Abnormal   Collection Time: 11/11/22  1:49 PM  Result Value Ref Range   WBC 11.2 (H) 3.4 - 10.8 x10E3/uL   RBC 5.09 4.14 - 5.80 x10E6/uL   Hemoglobin 16.7 13.0 - 17.7 g/dL   Hematocrit 16.1 09.6 - 51.0 %   MCV 92 79 - 97 fL   MCH 32.8 26.6 - 33.0 pg   MCHC 35.5 31.5 - 35.7 g/dL   RDW 04.5 40.9 - 81.1 %   Neutrophils 55 Not Estab. %   Lymphs 34 Not Estab. %   Monocytes 8 Not Estab. %   Eos 3 Not Estab. %   Basos 0 Not Estab. %   Neutrophils Absolute 6.1 1.4 - 7.0 x10E3/uL   Lymphocytes Absolute 3.8 (H) 0.7 - 3.1 x10E3/uL   Monocytes Absolute 0.9 0.1 - 0.9 x10E3/uL   EOS (ABSOLUTE) 0.4 0.0 - 0.4 x10E3/uL   Basophils Absolute 0.1 0.0 - 0.2 x10E3/uL   Immature Granulocytes 0 Not Estab. %   Immature Grans (Abs) 0.0 0.0 - 0.1 x10E3/uL    Comment: **Effective November 28, 2022, profile [id number] CBC/Differential**   (No Platelet) will be made non-orderable. [facility] Offers:   [id number] CBC With Differential/Platelet   CMP14+EGFR     Status: None   Collection Time: 11/11/22  1:49 PM  Result Value Ref Range   Glucose 89 70 - 99 mg/dL   BUN 10 6 - 24 mg/dL   Creatinine, Ser 9.56 0.76 - 1.27 mg/dL   eGFR 213 >08 MV/HQI/6.96   BUN/Creatinine Ratio 12 9 - 20   Sodium 142 134 - 144 mmol/L   Potassium 4.4 3.5 - 5.2 mmol/L   Chloride 102 96 - 106 mmol/L   CO2 24 20 - 29 mmol/L   Calcium 9.3 8.7 - 10.2 mg/dL   Total Protein 6.7 6.0 - 8.5 g/dL   Albumin 4.2 4.1 - 5.1 g/dL   Globulin, Total 2.5 1.5 - 4.5 g/dL   Bilirubin Total 0.2 0.0 - 1.2 mg/dL   Alkaline Phosphatase 109 44 - 121 IU/L   AST 12 0 - 40 IU/L   ALT 27 0 - 44 IU/L  TSH     Status: None   Collection Time: 11/11/22  1:49 PM  Result Value Ref Range   TSH 0.833  0.450 - 4.500 uIU/mL  Hemoglobin A1c     Status: None   Collection Time: 11/11/22  1:49 PM  Result Value Ref Range   Hgb A1c MFr Bld 5.5 4.8 - 5.6 %    Comment:          Prediabetes: 5.7 - 6.4          Diabetes: >6.4          Glycemic control for  adults with diabetes: <7.0    Est. average glucose Bld gHb Est-mCnc 111 mg/dL  Vitamin Z61     Status: None   Collection Time: 11/11/22  1:49 PM  Result Value Ref Range   Vitamin B-12 481 232 - 1,245 pg/mL  CBC with Diff     Status: Abnormal   Collection Time: 12/09/22 10:54 AM  Result Value Ref Range   WBC 11.3 (H) 3.4 - 10.8 x10E3/uL   RBC 5.17 4.14 - 5.80 x10E6/uL   Hemoglobin 16.5 13.0 - 17.7 g/dL   Hematocrit 09.6 04.5 - 51.0 %   MCV 92 79 - 97 fL   MCH 31.9 26.6 - 33.0 pg   MCHC 34.8 31.5 - 35.7 g/dL   RDW 40.9 81.1 - 91.4 %   Platelets 295 150 - 450 x10E3/uL   Neutrophils 57 Not Estab. %   Lymphs 33 Not Estab. %   Monocytes 7 Not Estab. %   Eos 3 Not Estab. %   Basos 0 Not Estab. %   Neutrophils Absolute 6.5 1.4 - 7.0 x10E3/uL   Lymphocytes Absolute 3.8 (H) 0.7 - 3.1 x10E3/uL   Monocytes Absolute 0.7 0.1 - 0.9 x10E3/uL   EOS (ABSOLUTE) 0.3 0.0 - 0.4 x10E3/uL   Basophils Absolute 0.1 0.0 - 0.2 x10E3/uL   Immature Granulocytes 0 Not Estab. %   Immature Grans (Abs) 0.0 0.0 - 0.1 x10E3/uL  POCT Urinalysis Dipstick ([id number])     Status: Normal   Collection Time: 12/12/22 10:51 AM  Result Value Ref Range   Color, UA     Clarity, UA     Glucose, UA Negative Negative   Bilirubin, UA Negative    Ketones, UA Negative    Spec Grav, UA 1.015 1.010 - 1.025   Blood, UA Negative    pH, UA 6.0 5.0 - 8.0   Protein, UA Negative Negative   Urobilinogen, UA 0.2 0.2 or 1.0 E.U./dL   Nitrite, UA Negative    Leukocytes, UA Negative Negative   Appearance     Odor         Assessment & Plan:   Problem List Items Addressed This Visit       Active Problems   Hypertension    Blood pressure well controlled with current medications.  Continue current therapy.  Will reassess at follow up.       Class 3 severe obesity due to excess calories with serious comorbidity and body mass index (BMI) of 40.0 to 44.9 in adult [facility])    Starting patient on [facility] today.  Samples given.    Will send RX and get his prior authorization taken care of.   Continue current meds.  Will adjust as needed based on results.  The patient is asked to make an attempt to improve diet and exercise patterns to aid in medical management of this problem. Addressed importance of increasing and maintaining water intake.        Primary insomnia    Patient stable.  Well controlled with current therapy.   Continue  current meds.       Other Visit Diagnoses     Abnormal blood chemistry    -  Primary   Relevant Orders   CBC with Diff (Completed)       Return in about 2 months (around 02/08/2023).   Total time spent: 20 minutes  Miki Kins, FNP  12/09/2022   This document may have been prepared by [facility] Voice Recognition software and as such may include unintentional dictation errors.

## 2023-01-01 NOTE — Assessment & Plan Note (Signed)
Blood pressure well controlled with current medications.  Continue current therapy.  Will reassess at follow up.  

## 2023-01-01 NOTE — Assessment & Plan Note (Signed)
Starting patient on [facility] today.  Samples given.   Will send RX and get his prior authorization taken care of.   Continue current meds.  Will adjust as needed based on results.  The patient is asked to make an attempt to improve diet and exercise patterns to aid in medical management of this problem. Addressed importance of increasing and maintaining water intake.

## 2023-01-01 NOTE — Assessment & Plan Note (Signed)
Patient stable.  Well controlled with current therapy.   Continue current meds.  

## 2023-01-05 ENCOUNTER — Other Ambulatory Visit: Payer: Self-pay

## 2023-01-05 MED ORDER — BUPROPION HCL ER (SR) 150 MG PO TB12: 150.0000 mg | ORAL_TABLET | Freq: Two times a day (BID) | ORAL | 0 refills | Status: DC

## 2023-01-07 ENCOUNTER — Other Ambulatory Visit: Payer: Self-pay | Admitting: Family

## 2023-01-24 ENCOUNTER — Other Ambulatory Visit: Payer: Self-pay

## 2023-01-24 MED ORDER — WEGOVY 1 MG/0.5ML ~~LOC~~ SOAJ
1.0000 mg | SUBCUTANEOUS | 2 refills | Status: DC
  Filled 2023-01-24: qty 2, 28d supply, fill #0

## 2023-02-13 ENCOUNTER — Other Ambulatory Visit: Payer: Self-pay

## 2023-02-13 ENCOUNTER — Encounter: Payer: Self-pay | Admitting: Family

## 2023-02-13 ENCOUNTER — Ambulatory Visit: Payer: Medicaid Other | Admitting: Family

## 2023-02-13 VITALS — BP 122/84 | HR 95 | Ht 72.0 in | Wt 310.0 lb

## 2023-02-13 DIAGNOSIS — I10 Essential (primary) hypertension: Secondary | ICD-10-CM

## 2023-02-13 DIAGNOSIS — E782 Mixed hyperlipidemia: Secondary | ICD-10-CM

## 2023-02-13 DIAGNOSIS — E538 Deficiency of other specified B group vitamins: Secondary | ICD-10-CM

## 2023-02-13 DIAGNOSIS — R5383 Other fatigue: Secondary | ICD-10-CM

## 2023-02-13 DIAGNOSIS — R7303 Prediabetes: Secondary | ICD-10-CM

## 2023-02-13 DIAGNOSIS — E559 Vitamin D deficiency, unspecified: Secondary | ICD-10-CM

## 2023-02-13 DIAGNOSIS — E66813 Obesity, class 3: Secondary | ICD-10-CM | POA: Diagnosis not present

## 2023-02-13 DIAGNOSIS — Z6841 Body Mass Index (BMI) 40.0 and over, adult: Secondary | ICD-10-CM

## 2023-02-13 MED ORDER — WEGOVY 1.7 MG/0.75ML ~~LOC~~ SOAJ
1.7000 mg | SUBCUTANEOUS | 1 refills | Status: DC
  Filled 2023-02-13: qty 3, 28d supply, fill #0

## 2023-02-13 NOTE — Assessment & Plan Note (Signed)
Patient is doing well with Reginal Lutes, has lost >20 pounds since starting.  Sending in increased dose for him to start when he is out of current pen.  Continue current meds.  Will adjust as needed based on results.  The patient is asked to make an attempt to improve diet and exercise patterns to aid in medical management of this problem. Addressed importance of increasing and maintaining water intake.

## 2023-02-13 NOTE — Assessment & Plan Note (Signed)
Blood pressure well controlled with current medications.  Continue current therapy.  Will reassess at follow up.  

## 2023-02-13 NOTE — Assessment & Plan Note (Signed)
Checking labs today.  Continue current therapy for lipid control. Will modify as needed based on labwork results.  

## 2023-02-13 NOTE — Progress Notes (Signed)
Established Patient Office Visit  Subjective:  Patient ID: Christopher Edwards, male    DOB: 02/13/1981  Age: 42 y.o. MRN: [id number]  Chief Complaint  Patient presents with   Follow-up    2 month follow up    Patient is here today for his 2 months follow up.  He has been feeling well since last appointment.   He does have additional concerns to discuss today.  He has been exercising and has lost significant amounts of weight with the [facility], has also been working to change his eating habits.  He has stopped smoking completely as well, says that he has not smoked for 45 days.  His energy level is still somewhat low, despite the changes to his diet and exercise regimen, and the weight loss.  Asks if we can check testosterone.   Labs are due today. He needs refills.   I have reviewed his active problem list, medication list, allergies, health maintenance, notes from last encounter, lab results for his appointment today.      No other concerns at this time.   Past Medical History:  Diagnosis Date   Hypertension     Past Surgical History:  Procedure Laterality Date   APPENDECTOMY  1984   TONSILLECTOMY  1989    Social History   Socioeconomic History   Marital status: Single    Spouse name: Not on file   Number of children: Not on file   Years of education: Not on file   Highest education level: Not on file  Occupational History   Not on file  Tobacco Use   Smoking status: Former    Current packs/day: 0.00    Average packs/day: 2.0 packs/day for 25.0 years (50.0 ttl pk-yrs)    Types: Cigarettes    Start date: 12/29/1997    Quit date: 12/30/2022    Years since quitting: 0.1   Smokeless tobacco: Never  Substance and Sexual Activity   Alcohol use: Not Currently   Drug use: Not Currently    Types: Amphetamines    Comment: stopped actively using 2 years ago   Sexual activity: Yes  Other Topics Concern   Not on file  Social History Narrative   Not on file   Social  Determinants of Health   Financial Resource Strain: Not on file  Food Insecurity: Not on file  Transportation Needs: Not on file  Physical Activity: Not on file  Stress: Not on file  Social Connections: Not on file  Intimate Partner Violence: Not on file    Family History  Problem Relation Age of Onset   Cancer Mother 15       liver   Alcoholism Father    Cirrhosis Father    Other Sister        Shunt in brain    No Known Allergies  Review of Systems  Constitutional:  Positive for malaise/fatigue.  Psychiatric/Behavioral:  The patient has insomnia.   All other systems reviewed and are negative.      Objective:   BP 122/84   Pulse 95   Ht 6' (1.829 m)   Wt (!) 310 lb (140.6 kg)   SpO2 95%   BMI 42.04 kg/m   Vitals:   02/13/23 0928  BP: 122/84  Pulse: 95  Height: 6' (1.829 m)  Weight: (!) 310 lb (140.6 kg)  SpO2: 95%  BMI (Calculated): 42.03    Physical Exam Vitals and nursing note reviewed.  Constitutional:  General: He is not in acute distress.    Appearance: Normal appearance. He is obese. He is not ill-appearing.  Eyes:     Pupils: Pupils are equal, round, and reactive to light.  Cardiovascular:     Rate and Rhythm: Normal rate and regular rhythm.     Pulses: Normal pulses.     Heart sounds: Normal heart sounds.  Pulmonary:     Effort: Pulmonary effort is normal.     Breath sounds: Normal breath sounds.  Neurological:     General: No focal deficit present.     Mental Status: He is alert and oriented to person, place, and time. Mental status is at baseline.  Psychiatric:        Mood and Affect: Mood normal.        Behavior: Behavior normal.        Thought Content: Thought content normal.        Judgment: Judgment normal.      No results found for any visits on 02/13/23.  Recent Results (from the past 2160 hour(s))  CBC with Diff     Status: Abnormal   Collection Time: 12/09/22 10:54 AM  Result Value Ref Range   WBC 11.3 (H) 3.4 -  10.8 x10E3/uL   RBC 5.17 4.14 - 5.80 x10E6/uL   Hemoglobin 16.5 13.0 - 17.7 g/dL   Hematocrit 16.1 09.6 - 51.0 %   MCV 92 79 - 97 fL   MCH 31.9 26.6 - 33.0 pg   MCHC 34.8 31.5 - 35.7 g/dL   RDW 04.5 40.9 - 81.1 %   Platelets 295 150 - 450 x10E3/uL   Neutrophils 57 Not Estab. %   Lymphs 33 Not Estab. %   Monocytes 7 Not Estab. %   Eos 3 Not Estab. %   Basos 0 Not Estab. %   Neutrophils Absolute 6.5 1.4 - 7.0 x10E3/uL   Lymphocytes Absolute 3.8 (H) 0.7 - 3.1 x10E3/uL   Monocytes Absolute 0.7 0.1 - 0.9 x10E3/uL   EOS (ABSOLUTE) 0.3 0.0 - 0.4 x10E3/uL   Basophils Absolute 0.1 0.0 - 0.2 x10E3/uL   Immature Granulocytes 0 Not Estab. %   Immature Grans (Abs) 0.0 0.0 - 0.1 x10E3/uL  POCT Urinalysis Dipstick ([id number])     Status: Normal   Collection Time: 12/12/22 10:51 AM  Result Value Ref Range   Color, UA     Clarity, UA     Glucose, UA Negative Negative   Bilirubin, UA Negative    Ketones, UA Negative    Spec Grav, UA 1.015 1.010 - 1.025   Blood, UA Negative    pH, UA 6.0 5.0 - 8.0   Protein, UA Negative Negative   Urobilinogen, UA 0.2 0.2 or 1.0 E.U./dL   Nitrite, UA Negative    Leukocytes, UA Negative Negative   Appearance     Odor         Assessment & Plan:   Problem List Items Addressed This Visit       Active Problems   Hypertension    Blood pressure well controlled with current medications.  Continue current therapy.  Will reassess at follow up.       Relevant Orders   CMP14+EGFR   CBC with Diff   Class 3 severe obesity due to excess calories with serious comorbidity and body mass index (BMI) of 40.0 to 44.9 in adult [facility])    Patient is doing well with Wegovy, has lost >20 pounds since starting.  Sending in increased  dose for him to start when he is out of current pen.  Continue current meds.  Will adjust as needed based on results.  The patient is asked to make an attempt to improve diet and exercise patterns to aid in medical management of this  problem. Addressed importance of increasing and maintaining water intake.       Relevant Medications   Semaglutide-Weight Management (WEGOVY) 1.7 MG/0.75ML SOAJ   Other Relevant Orders   CMP14+EGFR   CBC with Diff   Mixed hyperlipidemia - Primary    Checking labs today.  Continue current therapy for lipid control. Will modify as needed based on labwork results.       Relevant Orders   Lipid panel   CMP14+EGFR   CBC with Diff   Other Visit Diagnoses     B12 deficiency due to diet       Checking labs today.  Will continue supplements as needed.   Relevant Orders   CMP14+EGFR   Vitamin B12   CBC with Diff   Vitamin D deficiency, unspecified       Checking labs today.  Will continue supplements as needed.   Relevant Orders   VITAMIN D 25 Hydroxy (Vit-D Deficiency, Fractures)   CMP14+EGFR   CBC with Diff   Prediabetes       Patient counseled on dietary choices and verbalized understanding. Will reassess at follow up after next lab check.   Relevant Orders   CMP14+EGFR   Hemoglobin A1c   CBC with Diff   Other fatigue       Checking labs today.  Adding testosterone labs.  Patient informed of possible need for repeat in a few weeks if low. Will continue supplements as needed.   Relevant Orders   CMP14+EGFR   TSH   CBC with Diff   Testosterone,Free and Total       Return in about 3 months (around 05/16/2023).   Total time spent: 30 minutes  Miki Kins, FNP  02/13/2023   This document may have been prepared by [facility] Voice Recognition software and as such may include unintentional dictation errors.

## 2023-02-16 LAB — CBC WITH DIFFERENTIAL/PLATELET
Basophils Absolute: 0 10*3/uL (ref 0.0–0.2)
Basos: 0 %
EOS (ABSOLUTE): 0.2 10*3/uL (ref 0.0–0.4)
Eos: 2 %
Hematocrit: 48 % (ref 37.5–51.0)
Hemoglobin: 16 g/dL (ref 13.0–17.7)
Immature Grans (Abs): 0 10*3/uL (ref 0.0–0.1)
Immature Granulocytes: 0 %
Lymphocytes Absolute: 3 10*3/uL (ref 0.7–3.1)
Lymphs: 31 %
MCH: 31.7 pg (ref 26.6–33.0)
MCHC: 33.3 g/dL (ref 31.5–35.7)
MCV: 95 fL (ref 79–97)
Monocytes Absolute: 0.8 10*3/uL (ref 0.1–0.9)
Monocytes: 8 %
Neutrophils Absolute: 5.6 10*3/uL (ref 1.4–7.0)
Neutrophils: 59 %
Platelets: 267 10*3/uL (ref 150–450)
RBC: 5.05 x10E6/uL (ref 4.14–5.80)
RDW: 12.1 % (ref 11.6–15.4)
WBC: 9.6 10*3/uL (ref 3.4–10.8)

## 2023-02-16 LAB — LIPID PANEL
Chol/HDL Ratio: 3.7 {ratio} (ref 0.0–5.0)
Cholesterol, Total: 153 mg/dL (ref 100–199)
HDL: 41 mg/dL (ref >39)
LDL Chol Calc (NIH): 77 mg/dL (ref 0–99)
Triglycerides: 209 mg/dL — ABNORMAL HIGH (ref 0–149)
VLDL Cholesterol Cal: 35 mg/dL (ref 5–40)

## 2023-02-16 LAB — HEMOGLOBIN A1C
Est. average glucose Bld gHb Est-mCnc: 114 mg/dL
Hgb A1c MFr Bld: 5.6 % (ref 4.8–5.6)

## 2023-02-16 LAB — VITAMIN B12: Vitamin B-12: 601 pg/mL (ref 232–1245)

## 2023-02-16 LAB — CMP14+EGFR
ALT: 56 [IU]/L — ABNORMAL HIGH (ref 0–44)
AST: 29 [IU]/L (ref 0–40)
Albumin: 4.3 g/dL (ref 4.1–5.1)
Alkaline Phosphatase: 126 [IU]/L — ABNORMAL HIGH (ref 44–121)
BUN/Creatinine Ratio: 13 (ref 9–20)
BUN: 11 mg/dL (ref 6–24)
Bilirubin Total: 0.3 mg/dL (ref 0.0–1.2)
CO2: 25 mmol/L (ref 20–29)
Calcium: 9.5 mg/dL (ref 8.7–10.2)
Chloride: 102 mmol/L (ref 96–106)
Creatinine, Ser: 0.88 mg/dL (ref 0.76–1.27)
Globulin, Total: 2.6 g/dL (ref 1.5–4.5)
Glucose: 81 mg/dL (ref 70–99)
Potassium: 4.7 mmol/L (ref 3.5–5.2)
Sodium: 140 mmol/L (ref 134–144)
Total Protein: 6.9 g/dL (ref 6.0–8.5)
eGFR: 109 mL/min/{1.73_m2} (ref >59)

## 2023-02-16 LAB — TSH: TSH: 1.66 u[IU]/mL (ref 0.450–4.500)

## 2023-02-16 LAB — TESTOSTERONE,FREE AND TOTAL
Testosterone, Free: 4.2 pg/mL — ABNORMAL LOW (ref 6.8–21.5)
Testosterone: 147 ng/dL — ABNORMAL LOW (ref 264–916)

## 2023-02-16 LAB — VITAMIN D 25 HYDROXY (VIT D DEFICIENCY, FRACTURES): Vit D, 25-Hydroxy: 55.1 ng/mL (ref 30.0–100.0)

## 2023-03-02 ENCOUNTER — Other Ambulatory Visit: Payer: Medicaid Other

## 2023-03-02 DIAGNOSIS — R7989 Other specified abnormal findings of blood chemistry: Secondary | ICD-10-CM

## 2023-03-05 LAB — TESTOSTERONE,FREE AND TOTAL
Testosterone, Free: 3.5 pg/mL — ABNORMAL LOW (ref 6.8–21.5)
Testosterone: 151 ng/dL — ABNORMAL LOW (ref 264–916)

## 2023-03-07 MED ORDER — TESTOSTERONE 10 MG/ACT (2%) TD GEL: 1.0000 | Freq: Every day | TRANSDERMAL | 1 refills | Status: DC

## 2023-03-09 ENCOUNTER — Other Ambulatory Visit: Payer: Self-pay | Admitting: Family

## 2023-03-12 MED ORDER — SEMAGLUTIDE-WEIGHT MANAGEMENT 2.4 MG/0.75ML ~~LOC~~ SOAJ
2.4000 mg | SUBCUTANEOUS | 3 refills | Status: DC
  Filled 2023-03-12: qty 3, 28d supply, fill #0
  Filled 2023-04-18: qty 3, 28d supply, fill #1
  Filled 2023-05-16: qty 3, 28d supply, fill #2
  Filled 2023-06-08: qty 3, 28d supply, fill #3

## 2023-03-12 MED ORDER — TESTOSTERONE 20.25 MG/ACT (1.62%) TD GEL: 1.0000 | Freq: Every day | TRANSDERMAL | 3 refills | Status: DC

## 2023-03-13 ENCOUNTER — Other Ambulatory Visit: Payer: Self-pay

## 2023-05-16 ENCOUNTER — Encounter: Payer: Self-pay | Admitting: Family

## 2023-05-16 ENCOUNTER — Other Ambulatory Visit: Payer: Self-pay

## 2023-05-16 ENCOUNTER — Ambulatory Visit: Payer: Medicaid Other | Admitting: Family

## 2023-05-16 VITALS — BP 122/86 | HR 96 | Ht 72.0 in | Wt 304.6 lb

## 2023-05-16 DIAGNOSIS — E66813 Obesity, class 3: Secondary | ICD-10-CM

## 2023-05-16 DIAGNOSIS — R7303 Prediabetes: Secondary | ICD-10-CM

## 2023-05-16 DIAGNOSIS — R5383 Other fatigue: Secondary | ICD-10-CM

## 2023-05-16 DIAGNOSIS — Z6841 Body Mass Index (BMI) 40.0 and over, adult: Secondary | ICD-10-CM

## 2023-05-16 DIAGNOSIS — E291 Testicular hypofunction: Secondary | ICD-10-CM | POA: Insufficient documentation

## 2023-05-16 DIAGNOSIS — E782 Mixed hyperlipidemia: Secondary | ICD-10-CM

## 2023-05-16 DIAGNOSIS — E538 Deficiency of other specified B group vitamins: Secondary | ICD-10-CM

## 2023-05-16 DIAGNOSIS — F5101 Primary insomnia: Secondary | ICD-10-CM

## 2023-05-16 DIAGNOSIS — I10 Essential (primary) hypertension: Secondary | ICD-10-CM

## 2023-05-16 DIAGNOSIS — E559 Vitamin D deficiency, unspecified: Secondary | ICD-10-CM

## 2023-05-16 MED ORDER — ZALEPLON 10 MG PO CAPS: 10.0000 mg | ORAL_CAPSULE | Freq: Every evening | ORAL | 3 refills | Status: AC | PRN

## 2023-05-16 MED ORDER — ACYCLOVIR 5 % EX OINT: TOPICAL_OINTMENT | CUTANEOUS | 3 refills | Status: AC

## 2023-05-16 NOTE — Assessment & Plan Note (Signed)
 Checking labs today.  Continue current therapy for lipid control. Will modify as needed based on labwork results.

## 2023-05-16 NOTE — Assessment & Plan Note (Signed)
 Blood pressure well controlled with current medications.  Continue current therapy.  Will reassess at follow up.

## 2023-05-16 NOTE — Assessment & Plan Note (Signed)
 Patient tried the Zaleplon again, and these were helpful for him.  He asks if we can get him a refill for these given that he has been having success sleeping.   Will send RX and reassess at follow up.

## 2023-05-16 NOTE — Assessment & Plan Note (Signed)
 Checking testosterone levels today.  Will adjust therapy as needed.

## 2023-05-16 NOTE — Progress Notes (Signed)
 Established Patient Office Visit  Subjective:  Patient ID: Christopher Edwards, male    DOB: 1981/03/23  Age: 43 y.o. MRN: [id number]  Chief Complaint  Patient presents with   Follow-up    3 month follow up    Patient is here today for his 4 months follow up.  He has been feeling fairly well since last appointment.   He does not have additional concerns to discuss today.  Labs are due today. He needs refills.   I have reviewed his active problem list, medication list, allergies, notes from last encounter, lab results for his appointment today.    No other concerns at this time.   Past Medical History:  Diagnosis Date   Hypertension     Past Surgical History:  Procedure Laterality Date   APPENDECTOMY  1984   TONSILLECTOMY  1989    Social History   Socioeconomic History   Marital status: Single    Spouse name: Not on file   Number of children: Not on file   Years of education: Not on file   Highest education level: Not on file  Occupational History   Not on file  Tobacco Use   Smoking status: Former    Current packs/day: 0.00    Average packs/day: 2.0 packs/day for 25.0 years (50.0 ttl pk-yrs)    Types: Cigarettes    Start date: 12/29/1997    Quit date: 12/30/2022    Years since quitting: 0.3   Smokeless tobacco: Never  Substance and Sexual Activity   Alcohol use: Not Currently   Drug use: Not Currently    Types: Amphetamines    Comment: stopped actively using 2 years ago   Sexual activity: Yes  Other Topics Concern   Not on file  Social History Narrative   Not on file   Social Drivers of Health   Financial Resource Strain: Not on file  Food Insecurity: Not on file  Transportation Needs: Not on file  Physical Activity: Not on file  Stress: Not on file  Social Connections: Not on file  Intimate Partner Violence: Not on file    Family History  Problem Relation Age of Onset   Cancer Mother 67       liver   Alcoholism Father    Cirrhosis Father     Other Sister        Shunt in brain    No Known Allergies  Review of Systems  Psychiatric/Behavioral:  The patient has insomnia.   All other systems reviewed and are negative.      Objective:   BP 122/86   Pulse 96   Ht 6' (1.829 m)   Wt (!) 304 lb 9.6 oz (138.2 kg)   SpO2 96%   BMI 41.31 kg/m   Vitals:   05/16/23 0907  BP: 122/86  Pulse: 96  Height: 6' (1.829 m)  Weight: (!) 304 lb 9.6 oz (138.2 kg)  SpO2: 96%  BMI (Calculated): 41.3    Physical Exam Vitals and nursing note reviewed.  Constitutional:      Appearance: Normal appearance. He is normal weight.  HENT:     Nose: Nose normal.  Eyes:     Extraocular Movements: Extraocular movements intact.     Conjunctiva/sclera: Conjunctivae normal.     Pupils: Pupils are equal, round, and reactive to light.  Cardiovascular:     Rate and Rhythm: Normal rate and regular rhythm.     Pulses: Normal pulses.     Heart sounds:  Normal heart sounds.  Pulmonary:     Effort: Pulmonary effort is normal.     Breath sounds: Normal breath sounds.  Musculoskeletal:        General: Normal range of motion.  Neurological:     General: No focal deficit present.     Mental Status: He is alert and oriented to person, place, and time. Mental status is at baseline.  Psychiatric:        Mood and Affect: Mood normal.        Behavior: Behavior normal.        Thought Content: Thought content normal.        Judgment: Judgment normal.      No results found for any visits on 05/16/23.  Recent Results (from the past 2160 hours)  Testosterone ,Free and Total     Status: Abnormal   Collection Time: 03/02/23  9:10 AM  Result Value Ref Range   Testosterone  151 (L) 264 - 916 ng/dL    Comment: Adult male reference interval is based on a population of healthy nonobese males (BMI <30) between 41 and 34 years old. Travison, et.al. JCEM [phone]. PMID: 71675896.    Testosterone , Free 3.5 (L) 6.8 - 21.5 pg/mL       Assessment &  Plan:   Problem List Items Addressed This Visit       Cardiovascular and Mediastinum   Hypertension   Blood pressure well controlled with current medications.  Continue current therapy.  Will reassess at follow up.        Relevant Orders   CMP14+EGFR   CBC with Differential/Platelet     Endocrine   Testicular hypofunction   Checking testosterone  levels today.  Will adjust therapy as needed.      Relevant Orders   Testosterone ,Free and Total     Other   Class 3 severe obesity due to excess calories with serious comorbidity and body mass index (BMI) of 40.0 to 44.9 in adult [facility])   Relevant Orders   CMP14+EGFR   CBC with Differential/Platelet   Primary insomnia   Patient tried the Zaleplon  again, and these were helpful for him.  He asks if we can get him a refill for these given that he has been having success sleeping.   Will send RX and reassess at follow up.       Mixed hyperlipidemia - Primary   Checking labs today.  Continue current therapy for lipid control. Will modify as needed based on labwork results.        Relevant Orders   Lipid panel   CMP14+EGFR   CBC with Differential/Platelet   Other Visit Diagnoses       B12 deficiency due to diet       Checking labs today.  Will continue supplements as needed.   Relevant Orders   CMP14+EGFR   Vitamin B12   CBC with Differential/Platelet     Vitamin D  deficiency, unspecified       Checking labs today.  Will continue supplements as needed.   Relevant Orders   VITAMIN D  25 Hydroxy (Vit-D Deficiency, Fractures)   CMP14+EGFR   CBC with Differential/Platelet     Prediabetes       A1C is in prediabetic ranges. Patient counseled on dietary choices and verbalized understanding. Will reassess at follow up after next lab check.   Relevant Orders   CMP14+EGFR   Hemoglobin A1c   CBC with Differential/Platelet     Other fatigue  Relevant Orders   CMP14+EGFR   TSH   CBC with Differential/Platelet       Return in about 3 months (around 08/14/2023) for F/U.   Total time spent: 20 minutes  ALAN CHRISTELLA ARRANT, FNP  05/16/2023   This document may have been prepared by [facility] Voice Recognition software and as such may include unintentional dictation errors.

## 2023-05-19 LAB — CMP14+EGFR
ALT: 46 [IU]/L — ABNORMAL HIGH (ref 0–44)
AST: 25 [IU]/L (ref 0–40)
Albumin: 4.2 g/dL (ref 4.1–5.1)
Alkaline Phosphatase: 119 [IU]/L (ref 44–121)
BUN/Creatinine Ratio: 17 (ref 9–20)
BUN: 16 mg/dL (ref 6–24)
Bilirubin Total: <0.2 mg/dL (ref 0.0–1.2)
CO2: 21 mmol/L (ref 20–29)
Calcium: 9.4 mg/dL (ref 8.7–10.2)
Chloride: 102 mmol/L (ref 96–106)
Creatinine, Ser: 0.96 mg/dL (ref 0.76–1.27)
Globulin, Total: 2.4 g/dL (ref 1.5–4.5)
Glucose: 83 mg/dL (ref 70–99)
Potassium: 4.7 mmol/L (ref 3.5–5.2)
Sodium: 142 mmol/L (ref 134–144)
Total Protein: 6.6 g/dL (ref 6.0–8.5)
eGFR: 100 mL/min/{1.73_m2} (ref >59)

## 2023-05-19 LAB — VITAMIN D 25 HYDROXY (VIT D DEFICIENCY, FRACTURES): Vit D, 25-Hydroxy: 61.9 ng/mL (ref 30.0–100.0)

## 2023-05-19 LAB — VITAMIN B12: Vitamin B-12: 689 pg/mL (ref 232–1245)

## 2023-05-19 LAB — LIPID PANEL
Chol/HDL Ratio: 3.7 {ratio} (ref 0.0–5.0)
Cholesterol, Total: 146 mg/dL (ref 100–199)
HDL: 39 mg/dL — ABNORMAL LOW (ref >39)
LDL Chol Calc (NIH): 71 mg/dL (ref 0–99)
Triglycerides: 220 mg/dL — ABNORMAL HIGH (ref 0–149)
VLDL Cholesterol Cal: 36 mg/dL (ref 5–40)

## 2023-05-19 LAB — TESTOSTERONE,FREE AND TOTAL
Testosterone, Free: 2.6 pg/mL — ABNORMAL LOW (ref 6.8–21.5)
Testosterone: 101 ng/dL — ABNORMAL LOW (ref 264–916)

## 2023-05-19 LAB — HEMOGLOBIN A1C
Est. average glucose Bld gHb Est-mCnc: 103 mg/dL
Hgb A1c MFr Bld: 5.2 % (ref 4.8–5.6)

## 2023-05-19 LAB — TSH: TSH: 1.23 u[IU]/mL (ref 0.450–4.500)

## 2023-05-20 ENCOUNTER — Encounter: Payer: Self-pay | Admitting: Family

## 2023-06-05 ENCOUNTER — Encounter: Payer: Self-pay | Admitting: Family

## 2023-06-09 ENCOUNTER — Other Ambulatory Visit: Payer: Self-pay

## 2023-06-10 MED ORDER — XYOSTED 100 MG/0.5ML ~~LOC~~ SOAJ: 100.0000 mg | SUBCUTANEOUS | 3 refills | Status: DC

## 2023-07-03 ENCOUNTER — Other Ambulatory Visit: Payer: Self-pay

## 2023-07-03 ENCOUNTER — Other Ambulatory Visit: Payer: Self-pay | Admitting: Family

## 2023-07-03 MED ORDER — WEGOVY 2.4 MG/0.75ML ~~LOC~~ SOAJ
2.4000 mg | SUBCUTANEOUS | 3 refills | Status: DC
  Filled 2023-07-03: qty 3, 28d supply, fill #0
  Filled 2023-08-01 – 2023-08-10 (×4): qty 3, 28d supply, fill #1
  Filled 2023-09-03: qty 3, 28d supply, fill #2

## 2023-07-17 ENCOUNTER — Encounter: Payer: Self-pay | Admitting: Family

## 2023-07-20 ENCOUNTER — Ambulatory Visit: Admitting: Family

## 2023-07-20 ENCOUNTER — Encounter: Payer: Self-pay | Admitting: Family

## 2023-07-20 VITALS — BP 126/86 | HR 100 | Ht 72.0 in | Wt 296.8 lb

## 2023-07-20 DIAGNOSIS — E291 Testicular hypofunction: Secondary | ICD-10-CM

## 2023-07-20 DIAGNOSIS — F321 Major depressive disorder, single episode, moderate: Secondary | ICD-10-CM | POA: Diagnosis not present

## 2023-07-20 DIAGNOSIS — Z6841 Body Mass Index (BMI) 40.0 and over, adult: Secondary | ICD-10-CM

## 2023-07-20 DIAGNOSIS — E66813 Obesity, class 3: Secondary | ICD-10-CM | POA: Diagnosis not present

## 2023-07-20 MED ORDER — SILDENAFIL CITRATE 25 MG PO TABS: 25.0000 mg | ORAL_TABLET | Freq: Every day | ORAL | 0 refills | Status: DC | PRN

## 2023-07-20 MED ORDER — ESCITALOPRAM OXALATE 5 MG PO TABS
5.0000 mg | ORAL_TABLET | Freq: Every day | ORAL | 1 refills | Status: AC
Start: 2023-07-20 — End: ?

## 2023-07-20 NOTE — Progress Notes (Signed)
 Established Patient Office Visit  Subjective:  Patient ID: Christopher Edwards, male    DOB: Aug 13, 1980  Age: 43 y.o. MRN: [id number]  Chief Complaint  Patient presents with   Depression    Patient is here today for follow up as he has been having some concerns regarding his mental health.   He has been concerned that he might be having some depression, as he has had significant changes in behavior and has been having some trouble with his family as well.   Doing well otherwise, would like to continue his testosterone  dosing.     No other concerns at this time.   Past Medical History:  Diagnosis Date   Hypertension     Past Surgical History:  Procedure Laterality Date   APPENDECTOMY  1984   TONSILLECTOMY  1989    Social History   Socioeconomic History   Marital status: Single    Spouse name: Not on file   Number of children: Not on file   Years of education: Not on file   Highest education level: Not on file  Occupational History   Not on file  Tobacco Use   Smoking status: Former    Current packs/day: 0.00    Average packs/day: 2.0 packs/day for 25.0 years (50.0 ttl pk-yrs)    Types: Cigarettes    Start date: 12/29/1997    Quit date: 12/30/2022    Years since quitting: 0.7   Smokeless tobacco: Never  Substance and Sexual Activity   Alcohol use: Not Currently   Drug use: Not Currently    Types: Amphetamines    Comment: stopped actively using 2 years ago   Sexual activity: Yes  Other Topics Concern   Not on file  Social History Narrative   Not on file   Social Drivers of Health   Financial Resource Strain: Not on file  Food Insecurity: Not on file  Transportation Needs: Not on file  Physical Activity: Not on file  Stress: Not on file  Social Connections: Not on file  Intimate Partner Violence: Not on file    Family History  Problem Relation Age of Onset   Cancer Mother 60       liver   Alcoholism Father    Cirrhosis Father    Other Sister         Shunt in brain    No Known Allergies  Review of Systems  Psychiatric/Behavioral:  Positive for depression. The patient is nervous/anxious and has insomnia.   All other systems reviewed and are negative.      Objective:   BP 126/86   Pulse 100   Ht 6' (1.829 m)   Wt 296 lb 12.8 oz (134.6 kg)   SpO2 96%   BMI 40.25 kg/m   Vitals:   07/20/23 0910  BP: 126/86  Pulse: 100  Height: 6' (1.829 m)  Weight: 296 lb 12.8 oz (134.6 kg)  SpO2: 96%  BMI (Calculated): 40.24    Physical Exam Vitals and nursing note reviewed.  Constitutional:      Appearance: Normal appearance. He is normal weight.  Eyes:     Pupils: Pupils are equal, round, and reactive to light.  Cardiovascular:     Rate and Rhythm: Normal rate and regular rhythm.     Pulses: Normal pulses.     Heart sounds: Normal heart sounds.  Pulmonary:     Effort: Pulmonary effort is normal.     Breath sounds: Normal breath sounds.  Neurological:  General: No focal deficit present.     Mental Status: He is alert and oriented to person, place, and time.  Psychiatric:        Mood and Affect: Mood normal.        Behavior: Behavior normal.        Thought Content: Thought content normal.        Judgment: Judgment normal.      Results for orders placed or performed in visit on 07/20/23  Testosterone ,Free and Total  Result Value Ref Range   Testosterone  410 264 - 916 ng/dL   Testosterone , Free 12.1 6.8 - 21.5 pg/mL    Recent Results (from the past 2160 hours)  Testosterone ,Free and Total     Status: None   Collection Time: 07/20/23  9:44 AM  Result Value Ref Range   Testosterone  410 264 - 916 ng/dL    Comment: Adult male reference interval is based on a population of healthy nonobese males (BMI <30) between 71 and 10 years old. Travison, et.al. JCEM [phone]. PMID: 91478295.    Testosterone , Free 12.1 6.8 - 21.5 pg/mL  POCT rapid strep A     Status: None   Collection Time: 08/16/23  2:49 PM   Result Value Ref Range   Rapid Strep A Screen Negative Negative  POCT XPERT XPRESS SARS COVID-2/FLU/RSV [[id number]]     Status: Abnormal   Collection Time: 08/16/23  3:42 PM  Result Value Ref Range   SARS Coronavirus 2 Positive    FLU A Negative    FLU B Negative    RSV RNA, PCR Negative   CMP14+EGFR     Status: None   Collection Time: 09/15/23  9:59 AM  Result Value Ref Range   Glucose 89 70 - 99 mg/dL   BUN 15 6 - 24 mg/dL   Creatinine, Ser 8.46 0.76 - 1.27 mg/dL   eGFR 94 >96 EX/BMW/4.13   BUN/Creatinine Ratio 15 9 - 20   Sodium 140 134 - 144 mmol/L   Potassium 4.6 3.5 - 5.2 mmol/L   Chloride 103 96 - 106 mmol/L   CO2 24 20 - 29 mmol/L   Calcium 9.4 8.7 - 10.2 mg/dL   Total Protein 6.6 6.0 - 8.5 g/dL   Albumin 4.2 4.1 - 5.1 g/dL   Globulin, Total 2.4 1.5 - 4.5 g/dL   Bilirubin Total 0.3 0.0 - 1.2 mg/dL   Alkaline Phosphatase 95 44 - 121 IU/L   AST 25 0 - 40 IU/L   ALT 39 0 - 44 IU/L  Lipid panel     Status: Abnormal   Collection Time: 09/15/23  9:59 AM  Result Value Ref Range   Cholesterol, Total 143 100 - 199 mg/dL   Triglycerides 244 (H) 0 - 149 mg/dL   HDL 37 (L) >01 mg/dL   VLDL Cholesterol Cal 30 5 - 40 mg/dL   LDL Chol Calc (NIH) 76 0 - 99 mg/dL   Chol/HDL Ratio 3.9 0.0 - 5.0 ratio    Comment:                                   T. Chol/HDL Ratio                                             Men  Women  1/2 Avg.Risk  3.4    3.3                                   Avg.Risk  5.0    4.4                                2X Avg.Risk  9.6    7.1                                3X Avg.Risk 23.4   11.0   VITAMIN D  25 Hydroxy (Vit-D Deficiency, Fractures)     Status: None   Collection Time: 09/15/23  9:59 AM  Result Value Ref Range   Vit D, 25-Hydroxy 65.5 30.0 - 100.0 ng/mL    Comment: Vitamin D  deficiency has been defined by the Institute of Medicine and an Endocrine Society practice guideline as a level of serum 25-OH vitamin D  less than 20  ng/mL (1,2). The Endocrine Society went on to further define vitamin D  insufficiency as a level between 21 and 29 ng/mL (2). 1. IOM (Institute of Medicine). 2010. Dietary reference    intakes for calcium and D. [place]: The    [employer]. 2. Holick MF, Binkley Odell, Bischoff-Ferrari HA, et al.    Evaluation, treatment, and prevention of vitamin D     deficiency: an Endocrine Society clinical practice    guideline. JCEM. 2011 Jul; 96(7):1911-30.   Vitamin B12     Status: None   Collection Time: 09/15/23  9:59 AM  Result Value Ref Range   Vitamin B-12 877 232 - 1,245 pg/mL  CBC with Diff     Status: Abnormal   Collection Time: 09/15/23  9:59 AM  Result Value Ref Range   WBC 11.9 (H) 3.4 - 10.8 x10E3/uL   RBC 5.44 4.14 - 5.80 x10E6/uL   Hemoglobin 17.1 13.0 - 17.7 g/dL   Hematocrit 54.0 (H) 98.1 - 51.0 %   MCV 94 79 - 97 fL   MCH 31.4 26.6 - 33.0 pg   MCHC 33.5 31.5 - 35.7 g/dL   RDW 19.1 47.8 - 29.5 %   Platelets 253 150 - 450 x10E3/uL   Neutrophils 64 Not Estab. %   Lymphs 27 Not Estab. %   Monocytes 8 Not Estab. %   Eos 1 Not Estab. %   Basos 0 Not Estab. %   Neutrophils Absolute 7.5 (H) 1.4 - 7.0 x10E3/uL   Lymphocytes Absolute 3.2 (H) 0.7 - 3.1 x10E3/uL   Monocytes Absolute 1.0 (H) 0.1 - 0.9 x10E3/uL   EOS (ABSOLUTE) 0.2 0.0 - 0.4 x10E3/uL   Basophils Absolute 0.0 0.0 - 0.2 x10E3/uL   Immature Granulocytes 0 Not Estab. %   Immature Grans (Abs) 0.0 0.0 - 0.1 x10E3/uL  Hemoglobin A1c     Status: None   Collection Time: 09/15/23  9:59 AM  Result Value Ref Range   Hgb A1c MFr Bld 5.3 4.8 - 5.6 %    Comment:          Prediabetes: 5.7 - 6.4          Diabetes: >6.4          Glycemic control for adults with diabetes: <7.0    Est. average glucose Bld gHb Est-mCnc 105 mg/dL  Testosterone ,Free and  Total     Status: None   Collection Time: 09/15/23  9:59 AM  Result Value Ref Range   Testosterone  337 264 - 916 ng/dL    Comment: Adult male reference interval  is based on a population of healthy nonobese males (BMI <30) between 29 and 66 years old. Travison, et.al. JCEM [phone]. PMID: 91478295.    Testosterone , Free 12.9 6.8 - 21.5 pg/mL  TSH     Status: None   Collection Time: 09/15/23  9:59 AM  Result Value Ref Range   TSH 0.760 0.450 - 4.500 uIU/mL       Assessment & Plan:   Problem List Items Addressed This Visit       Endocrine   Testicular hypofunction - Primary   Checking testosterone  levels today.  Will adjust therapy as needed.      Relevant Orders   Testosterone ,Free and Total (Completed)     Other   Class 3 severe obesity due to excess calories with serious comorbidity and body mass index (BMI) of 40.0 to 44.9 in adult   Continue current meds.  Will adjust as needed based on results.  The patient is asked to make an attempt to improve diet and exercise patterns to aid in medical management of this problem. Addressed importance of increasing and maintaining water intake.        Current moderate episode of major depressive disorder without prior episode (HCC)   Starting patient on lexapro  for his symptoms.  Will recheck at follow up. j      Relevant Medications   escitalopram  (LEXAPRO ) 5 MG tablet    Return in about 1 month (around 08/20/2023) for F/U.   Total time spent: 20 minutes  Trenda Frisk, FNP  07/20/2023   This document may have been prepared by [facility] Voice Recognition software and as such may include unintentional dictation errors.

## 2023-07-25 LAB — TESTOSTERONE,FREE AND TOTAL
Testosterone, Free: 12.1 pg/mL (ref 6.8–21.5)
Testosterone: 410 ng/dL (ref 264–916)

## 2023-08-01 ENCOUNTER — Other Ambulatory Visit: Payer: Self-pay

## 2023-08-03 ENCOUNTER — Other Ambulatory Visit: Payer: Self-pay

## 2023-08-04 ENCOUNTER — Encounter: Payer: Self-pay | Admitting: Family

## 2023-08-04 ENCOUNTER — Other Ambulatory Visit: Payer: Self-pay

## 2023-08-10 ENCOUNTER — Other Ambulatory Visit: Payer: Self-pay

## 2023-08-14 ENCOUNTER — Ambulatory Visit: Payer: Medicaid Other | Admitting: Family

## 2023-08-16 ENCOUNTER — Ambulatory Visit: Admitting: Family

## 2023-08-16 ENCOUNTER — Encounter: Payer: Self-pay | Admitting: Family

## 2023-08-16 VITALS — BP 130/80 | HR 92 | Temp 94.7°F | Ht 72.0 in | Wt 287.6 lb

## 2023-08-16 DIAGNOSIS — J029 Acute pharyngitis, unspecified: Secondary | ICD-10-CM | POA: Diagnosis not present

## 2023-08-16 DIAGNOSIS — U071 COVID-19: Secondary | ICD-10-CM | POA: Diagnosis not present

## 2023-08-16 DIAGNOSIS — J069 Acute upper respiratory infection, unspecified: Secondary | ICD-10-CM

## 2023-08-16 LAB — POCT XPERT XPRESS SARS COVID-2/FLU/RSV
FLU A: NEGATIVE
FLU B: NEGATIVE
RSV RNA, PCR: NEGATIVE
SARS Coronavirus 2: POSITIVE

## 2023-08-16 LAB — POCT RAPID STREP A (OFFICE): Rapid Strep A Screen: NEGATIVE

## 2023-08-16 MED ORDER — PAXLOVID (300/100) 20 X 150 MG & 10 X 100MG PO TBPK
3.0000 | ORAL_TABLET | Freq: Two times a day (BID) | ORAL | 0 refills | Status: DC
Start: 2023-08-16 — End: 2023-09-15

## 2023-08-16 NOTE — Progress Notes (Signed)
 Acute Office Visit  Subjective:     Patient ID: Christopher Edwards, male    DOB: 09-23-1980, 43 y.o.   MRN: [id number]  Patient is in today for  Chief Complaint  Patient presents with   Acute Visit    Sore throat    URI  This is a new problem. The current episode started in the past 7 days. The problem has been gradually worsening. Maximum temperature: unsure, has not taken temperature. Associated symptoms include congestion, coughing, headaches, rhinorrhea, sinus pain, a sore throat and swollen glands. He has tried acetaminophen, decongestant, sleep and eating for the symptoms. The treatment provided no relief.     Review of Systems  HENT:  Positive for congestion, rhinorrhea, sinus pain and sore throat.   Respiratory:  Positive for cough.   Neurological:  Positive for headaches.  All other systems reviewed and are negative.       Objective:    BP 130/80   Pulse 92   Temp (!) 94.7 F (34.8 C)   Ht 6' (1.829 m)   Wt 287 lb 9.6 oz (130.5 kg)   SpO2 97%   BMI 39.01 kg/m   Physical Exam Vitals and nursing note reviewed.  Constitutional:      Appearance: Normal appearance. He is obese.  HENT:     Head: Normocephalic and atraumatic.     Nose: Congestion and rhinorrhea present.  Eyes:     Pupils: Pupils are equal, round, and reactive to light.  Cardiovascular:     Rate and Rhythm: Normal rate and regular rhythm.     Pulses: Normal pulses.     Heart sounds: Normal heart sounds.  Pulmonary:     Effort: Pulmonary effort is normal.     Breath sounds: Normal breath sounds.  Neurological:     General: No focal deficit present.     Mental Status: He is alert and oriented to person, place, and time. Mental status is at baseline.  Psychiatric:        Mood and Affect: Mood normal.        Behavior: Behavior normal.        Thought Content: Thought content normal.        Judgment: Judgment normal.     Results for orders placed or performed in visit on 08/16/23  POCT XPERT  XPRESS SARS COVID-2/FLU/RSV [[id number]]  Result Value Ref Range   SARS Coronavirus 2 Positive    FLU A Negative    FLU B Negative    RSV RNA, PCR Negative   POCT rapid strep A  Result Value Ref Range   Rapid Strep A Screen Negative Negative    Recent Results (from the past 2160 hours)  Testosterone,Free and Total     Status: None   Collection Time: 07/20/23  9:44 AM  Result Value Ref Range   Testosterone 410 264 - 916 ng/dL    Comment: Adult male reference interval is based on a population of healthy nonobese males (BMI <30) between 51 and 96 years old. Travison, et.al. JCEM [phone]. PMID: 28413244.    Testosterone, Free 12.1 6.8 - 21.5 pg/mL  POCT rapid strep A     Status: None   Collection Time: 08/16/23  2:49 PM  Result Value Ref Range   Rapid Strep A Screen Negative Negative  POCT XPERT XPRESS SARS COVID-2/FLU/RSV [[id number]]     Status: Abnormal   Collection Time: 08/16/23  3:42 PM  Result Value Ref Range   SARS Coronavirus  2 Positive    FLU A Negative    FLU B Negative    RSV RNA, PCR Negative     Allergies as of 08/16/2023   No Known Allergies      Medication List        Accurate as of August 16, 2023  4:03 PM. If you have any questions, ask your nurse or doctor.          acyclovir ointment 5 % Commonly known as: Zovirax Apply thin layer to affected area   clindamycin 300 MG capsule Commonly known as: CLEOCIN Take 300 mg by mouth 4 (four) times daily.   escitalopram 5 MG tablet Commonly known as: LEXAPRO Take 1 tablet (5 mg total) by mouth daily.   Paxlovid (300/100) 20 x 150 MG & 10 x 100MG  Tbpk Generic drug: nirmatrelvir/ritonavir Take 3 tablets by mouth every 12 (twelve) hours. Started by: Jemia Fata M Mireya Meditz   sildenafil 25 MG tablet Commonly known as: VIAGRA Take 1 tablet (25 mg total) by mouth daily as needed for erectile dysfunction.   Wegovy 2.4 MG/0.75ML Soaj Generic drug: Semaglutide-Weight Management Inject 2.4 mg into  the skin once a week.   Xyosted 100 MG/0.5ML Soaj Generic drug: Testosterone Enanthate Inject 0.5 mLs (100 mg total) into the skin once a week.   zaleplon 10 MG capsule Commonly known as: SONATA Take 1 capsule (10 mg total) by mouth at bedtime as needed for sleep.            Assessment & Plan:   Problem List Items Addressed This Visit   None Visit Diagnoses       Acute upper respiratory infection    -  Primary   Relevant Medications   clindamycin (CLEOCIN) 300 MG capsule   nirmatrelvir/ritonavir (PAXLOVID, 300/100,) 20 x 150 MG & 10 x 100MG  TBPK   Other Relevant Orders   POCT XPERT XPRESS SARS COVID-2/FLU/RSV [[id number]] (Completed)   POCT rapid strep A (Completed)     Pharyngitis, unspecified etiology       Relevant Orders   POCT XPERT XPRESS SARS COVID-2/FLU/RSV [[id number]] (Completed)   POCT rapid strep A (Completed)     COVID-19       Relevant Medications   clindamycin (CLEOCIN) 300 MG capsule   nirmatrelvir/ritonavir (PAXLOVID, 300/100,) 20 x 150 MG & 10 x 100MG  TBPK      Paxlovid sent to pharmacy for patient. Will let me know if he doesn't feel any better by the beginning of next week.    No follow-ups on file.  Total time spent: 20 minutes  Trenda Frisk, FNP  08/16/2023   This document may have been prepared by [facility] Voice Recognition software and as such may include unintentional dictation errors.

## 2023-08-18 ENCOUNTER — Encounter: Payer: Self-pay | Admitting: Family

## 2023-08-21 ENCOUNTER — Other Ambulatory Visit: Payer: Self-pay

## 2023-08-21 ENCOUNTER — Other Ambulatory Visit: Payer: Self-pay | Admitting: Family

## 2023-08-21 MED ORDER — SILDENAFIL CITRATE 25 MG PO TABS: 25.0000 mg | ORAL_TABLET | Freq: Every day | ORAL | 0 refills | Status: DC | PRN

## 2023-08-22 MED ORDER — LIDOCAINE VISCOUS HCL 2 % MT SOLN: 15.0000 mL | OROMUCOSAL | 0 refills | Status: AC | PRN

## 2023-09-15 ENCOUNTER — Other Ambulatory Visit: Payer: Self-pay

## 2023-09-15 ENCOUNTER — Ambulatory Visit: Admitting: Family

## 2023-09-15 ENCOUNTER — Encounter: Payer: Self-pay | Admitting: Family

## 2023-09-15 DIAGNOSIS — E559 Vitamin D deficiency, unspecified: Secondary | ICD-10-CM

## 2023-09-15 DIAGNOSIS — E782 Mixed hyperlipidemia: Secondary | ICD-10-CM | POA: Diagnosis not present

## 2023-09-15 DIAGNOSIS — E538 Deficiency of other specified B group vitamins: Secondary | ICD-10-CM

## 2023-09-15 DIAGNOSIS — E66813 Obesity, class 3: Secondary | ICD-10-CM

## 2023-09-15 DIAGNOSIS — R7303 Prediabetes: Secondary | ICD-10-CM

## 2023-09-15 DIAGNOSIS — I10 Essential (primary) hypertension: Secondary | ICD-10-CM | POA: Diagnosis not present

## 2023-09-15 DIAGNOSIS — Z6841 Body Mass Index (BMI) 40.0 and over, adult: Secondary | ICD-10-CM

## 2023-09-15 DIAGNOSIS — R5383 Other fatigue: Secondary | ICD-10-CM

## 2023-09-15 MED ORDER — WEGOVY 2.4 MG/0.75ML ~~LOC~~ SOAJ: 2.4000 mg | SUBCUTANEOUS | 1 refills | Status: DC

## 2023-09-15 MED ORDER — WEGOVY 2.4 MG/0.75ML ~~LOC~~ SOAJ
2.4000 mg | SUBCUTANEOUS | 1 refills | Status: AC
Start: 2023-09-15 — End: ?
  Filled 2023-09-15 – 2023-09-22 (×2): qty 9, 84d supply, fill #0
  Filled 2023-09-26: qty 3, 28d supply, fill #0

## 2023-09-15 MED ORDER — XYOSTED 100 MG/0.5ML ~~LOC~~ SOAJ: 100.0000 mg | SUBCUTANEOUS | 0 refills | Status: DC

## 2023-09-15 NOTE — Assessment & Plan Note (Signed)
Patient is doing well with Christopher Edwards, has lost >20 pounds since starting.  Sending in increased dose for him to start when he is out of current pen.  Continue current meds.  Will adjust as needed based on results.  The patient is asked to make an attempt to improve diet and exercise patterns to aid in medical management of this problem. Addressed importance of increasing and maintaining water intake.

## 2023-09-15 NOTE — Assessment & Plan Note (Signed)
 Blood pressure well controlled with current medications.  Continue current therapy.  Will reassess at follow up.

## 2023-09-15 NOTE — Assessment & Plan Note (Signed)
 Checking labs today.  Continue current therapy for lipid control. Will modify as needed based on labwork results.

## 2023-09-15 NOTE — Progress Notes (Signed)
 Established Patient Office Visit  Subjective:  Patient ID: Christopher Edwards, male    DOB: 1980-10-22  Age: 43 y.o. MRN: [id number]  Chief Complaint  Patient presents with   Follow-up    1 month follow up    Patient is here today for his 1 months follow up.  He has been feeling well since last appointment.   He does have additional concerns to discuss today.  He is losing his insurance at the end of the month, so he asks if we can get him enough refills to last a few months, and asks if we can send the vial of his testosterone  once he is out.   Labs are due today. He needs refills.   I have reviewed his active problem list, medication list, allergies, notes from last encounter, lab results for his appointment today.      No other concerns at this time.   Past Medical History:  Diagnosis Date   Hypertension     Past Surgical History:  Procedure Laterality Date   APPENDECTOMY  1984   TONSILLECTOMY  1989    Social History   Socioeconomic History   Marital status: Single    Spouse name: Not on file   Number of children: Not on file   Years of education: Not on file   Highest education level: Not on file  Occupational History   Not on file  Tobacco Use   Smoking status: Former    Current packs/day: 0.00    Average packs/day: 2.0 packs/day for 25.0 years (50.0 ttl pk-yrs)    Types: Cigarettes    Start date: 12/29/1997    Quit date: 12/30/2022    Years since quitting: 0.7   Smokeless tobacco: Never  Substance and Sexual Activity   Alcohol use: Not Currently   Drug use: Not Currently    Types: Amphetamines    Comment: stopped actively using 2 years ago   Sexual activity: Yes  Other Topics Concern   Not on file  Social History Narrative   Not on file   Social Drivers of Health   Financial Resource Strain: Not on file  Food Insecurity: Not on file  Transportation Needs: Not on file  Physical Activity: Not on file  Stress: Not on file  Social Connections:  Not on file  Intimate Partner Violence: Not on file    Family History  Problem Relation Age of Onset   Cancer Mother 73       liver   Alcoholism Father    Cirrhosis Father    Other Sister        Shunt in brain    No Known Allergies  Review of Systems  All other systems reviewed and are negative.      Objective:   BP 124/78   Pulse (!) 107   Ht 6' (1.829 m)   Wt 289 lb 9.6 oz (131.4 kg)   SpO2 96%   BMI 39.28 kg/m   Vitals:   09/15/23 0919  BP: 124/78  Pulse: (!) 107  Height: 6' (1.829 m)  Weight: 289 lb 9.6 oz (131.4 kg)  SpO2: 96%  BMI (Calculated): 39.27    Physical Exam Vitals and nursing note reviewed.  Constitutional:      Appearance: Normal appearance. He is obese.  HENT:     Head: Normocephalic and atraumatic.     Nose: Nose normal.  Eyes:     Pupils: Pupils are equal, round, and reactive to light.  Cardiovascular:     Rate and Rhythm: Normal rate and regular rhythm.     Pulses: Normal pulses.     Heart sounds: Normal heart sounds.  Pulmonary:     Effort: Pulmonary effort is normal.     Breath sounds: Normal breath sounds.  Musculoskeletal:        General: Normal range of motion.     Cervical back: Normal range of motion.  Neurological:     General: No focal deficit present.     Mental Status: He is alert and oriented to person, place, and time. Mental status is at baseline.  Psychiatric:        Mood and Affect: Mood normal.        Behavior: Behavior normal.        Thought Content: Thought content normal.        Judgment: Judgment normal.      No results found for any visits on 09/15/23.  Recent Results (from the past 2160 hours)  Testosterone ,Free and Total     Status: None   Collection Time: 07/20/23  9:44 AM  Result Value Ref Range   Testosterone  410 264 - 916 ng/dL    Comment: Adult male reference interval is based on a population of healthy nonobese males (BMI <30) between 50 and 7 years old. Travison, et.al. JCEM  [phone]. PMID: [id number].    Testosterone , Free 12.1 6.8 - 21.5 pg/mL  POCT rapid strep A     Status: None   Collection Time: 08/16/23  2:49 PM  Result Value Ref Range   Rapid Strep A Screen Negative Negative  POCT XPERT XPRESS SARS COVID-2/FLU/RSV [[id number]]     Status: Abnormal   Collection Time: 08/16/23  3:42 PM  Result Value Ref Range   SARS Coronavirus 2 Positive    FLU A Negative    FLU B Negative    RSV RNA, PCR Negative        Assessment & Plan:   Problem List Items Addressed This Visit       Cardiovascular and Mediastinum   Hypertension   Blood pressure well controlled with current medications.  Continue current therapy.  Will reassess at follow up.        Relevant Orders   CMP14+EGFR   CBC with Diff     Other   Class 3 severe obesity due to excess calories with serious comorbidity and body mass index (BMI) of 40.0 to 44.9 in adult   Patient is doing well with Wegovy , has lost >20 pounds since starting.  Sending in increased dose for him to start when he is out of current pen.  Continue current meds.  Will adjust as needed based on results.  The patient is asked to make an attempt to improve diet and exercise patterns to aid in medical management of this problem. Addressed importance of increasing and maintaining water intake.       Relevant Medications   Semaglutide -Weight Management (WEGOVY ) 2.4 MG/0.75ML SOAJ   Other Relevant Orders   CMP14+EGFR   CBC with Diff   Mixed hyperlipidemia   Checking labs today.  Continue current therapy for lipid control. Will modify as needed based on labwork results.        Relevant Orders   CMP14+EGFR   Lipid panel   CBC with Diff   Other Visit Diagnoses       B12 deficiency due to diet       Checking labs today.  Will continue supplements as  needed.   Relevant Orders   CMP14+EGFR   Vitamin B12   CBC with Diff     Vitamin D  deficiency, unspecified       Checking labs today.  Will continue  supplements as needed.   Relevant Orders   CMP14+EGFR   VITAMIN D  25 Hydroxy (Vit-D Deficiency, Fractures)   CBC with Diff     Prediabetes       Patient counseled on dietary choices and verbalized understanding. Will reassess at follow up after next lab check.   Relevant Orders   CMP14+EGFR   CBC with Diff   Hemoglobin A1c     Other fatigue       Checking labs today.  Adding testosterone  labs.  Patient informed of possible need for repeat in a few weeks if low. Will continue supplements as needed.   Relevant Orders   CMP14+EGFR   CBC with Diff   Testosterone ,Free and Total   TSH       Return in about 6 months (around 03/17/2024).   Total time spent: 20 minutes  Trenda Frisk, FNP  09/15/2023   This document may have been prepared by [facility] Voice Recognition software and as such may include unintentional dictation errors.

## 2023-09-17 LAB — CMP14+EGFR
ALT: 39 IU/L (ref 0–44)
AST: 25 IU/L (ref 0–40)
Albumin: 4.2 g/dL (ref 4.1–5.1)
Alkaline Phosphatase: 95 IU/L (ref 44–121)
BUN/Creatinine Ratio: 15 (ref 9–20)
BUN: 15 mg/dL (ref 6–24)
Bilirubin Total: 0.3 mg/dL (ref 0.0–1.2)
CO2: 24 mmol/L (ref 20–29)
Calcium: 9.4 mg/dL (ref 8.7–10.2)
Chloride: 103 mmol/L (ref 96–106)
Creatinine, Ser: 1.01 mg/dL (ref 0.76–1.27)
Globulin, Total: 2.4 g/dL (ref 1.5–4.5)
Glucose: 89 mg/dL (ref 70–99)
Potassium: 4.6 mmol/L (ref 3.5–5.2)
Sodium: 140 mmol/L (ref 134–144)
Total Protein: 6.6 g/dL (ref 6.0–8.5)
eGFR: 94 mL/min/{1.73_m2} (ref >59)

## 2023-09-17 LAB — LIPID PANEL
Chol/HDL Ratio: 3.9 ratio (ref 0.0–5.0)
Cholesterol, Total: 143 mg/dL (ref 100–199)
HDL: 37 mg/dL — ABNORMAL LOW (ref >39)
LDL Chol Calc (NIH): 76 mg/dL (ref 0–99)
Triglycerides: 178 mg/dL — ABNORMAL HIGH (ref 0–149)
VLDL Cholesterol Cal: 30 mg/dL (ref 5–40)

## 2023-09-17 LAB — CBC WITH DIFFERENTIAL/PLATELET
Basophils Absolute: 0 10*3/uL (ref 0.0–0.2)
Basos: 0 %
EOS (ABSOLUTE): 0.2 10*3/uL (ref 0.0–0.4)
Eos: 1 %
Hematocrit: 51.1 % — ABNORMAL HIGH (ref 37.5–51.0)
Hemoglobin: 17.1 g/dL (ref 13.0–17.7)
Immature Grans (Abs): 0 10*3/uL (ref 0.0–0.1)
Immature Granulocytes: 0 %
Lymphocytes Absolute: 3.2 10*3/uL — ABNORMAL HIGH (ref 0.7–3.1)
Lymphs: 27 %
MCH: 31.4 pg (ref 26.6–33.0)
MCHC: 33.5 g/dL (ref 31.5–35.7)
MCV: 94 fL (ref 79–97)
Monocytes Absolute: 1 10*3/uL — ABNORMAL HIGH (ref 0.1–0.9)
Monocytes: 8 %
Neutrophils Absolute: 7.5 10*3/uL — ABNORMAL HIGH (ref 1.4–7.0)
Neutrophils: 64 %
Platelets: 253 10*3/uL (ref 150–450)
RBC: 5.44 x10E6/uL (ref 4.14–5.80)
RDW: 12.3 % (ref 11.6–15.4)
WBC: 11.9 10*3/uL — ABNORMAL HIGH (ref 3.4–10.8)

## 2023-09-17 LAB — VITAMIN B12: Vitamin B-12: 877 pg/mL (ref 232–1245)

## 2023-09-17 LAB — HEMOGLOBIN A1C
Est. average glucose Bld gHb Est-mCnc: 105 mg/dL
Hgb A1c MFr Bld: 5.3 % (ref 4.8–5.6)

## 2023-09-17 LAB — TESTOSTERONE,FREE AND TOTAL
Testosterone, Free: 12.9 pg/mL (ref 6.8–21.5)
Testosterone: 337 ng/dL (ref 264–916)

## 2023-09-17 LAB — TSH: TSH: 0.76 u[IU]/mL (ref 0.450–4.500)

## 2023-09-17 LAB — VITAMIN D 25 HYDROXY (VIT D DEFICIENCY, FRACTURES): Vit D, 25-Hydroxy: 65.5 ng/mL (ref 30.0–100.0)

## 2023-09-21 ENCOUNTER — Ambulatory Visit: Payer: Self-pay

## 2023-09-22 ENCOUNTER — Other Ambulatory Visit: Payer: Self-pay

## 2023-09-24 DIAGNOSIS — F321 Major depressive disorder, single episode, moderate: Secondary | ICD-10-CM | POA: Insufficient documentation

## 2023-09-24 NOTE — Assessment & Plan Note (Signed)
 Checking testosterone levels today.  Will adjust therapy as needed.

## 2023-09-24 NOTE — Assessment & Plan Note (Signed)
 Continue current meds.  Will adjust as needed based on results.  The patient is asked to make an attempt to improve diet and exercise patterns to aid in medical management of this problem. Addressed importance of increasing and maintaining water intake.

## 2023-09-24 NOTE — Assessment & Plan Note (Signed)
 Starting patient on lexapro  for his symptoms.  Will recheck at follow up. j

## 2023-09-26 ENCOUNTER — Other Ambulatory Visit: Payer: Self-pay

## 2023-09-27 ENCOUNTER — Encounter: Payer: Self-pay | Admitting: Family

## 2023-09-27 MED ORDER — SILDENAFIL CITRATE 25 MG PO TABS: 25.0000 mg | ORAL_TABLET | Freq: Every day | ORAL | 0 refills | Status: DC | PRN

## 2023-10-17 ENCOUNTER — Encounter: Payer: Self-pay | Admitting: Family

## 2023-10-20 ENCOUNTER — Other Ambulatory Visit: Payer: Self-pay

## 2023-10-21 MED ORDER — TESTOSTERONE CYPIONATE 200 MG/ML IM SOLN: 100.0000 mg | INTRAMUSCULAR | 2 refills | Status: DC

## 2023-11-19 ENCOUNTER — Encounter: Payer: Self-pay | Admitting: Family

## 2023-11-20 ENCOUNTER — Other Ambulatory Visit: Payer: Self-pay

## 2023-11-20 MED ORDER — SILDENAFIL CITRATE 50 MG PO TABS
50.0000 mg | ORAL_TABLET | ORAL | 1 refills | Status: DC | PRN
Start: 2023-11-20 — End: 2023-11-20

## 2023-11-20 MED ORDER — SILDENAFIL CITRATE 50 MG PO TABS: 50.0000 mg | ORAL_TABLET | ORAL | 1 refills | Status: DC | PRN

## 2024-01-05 ENCOUNTER — Encounter: Payer: Self-pay | Admitting: Family

## 2024-01-08 ENCOUNTER — Other Ambulatory Visit: Payer: Self-pay

## 2024-01-10 MED ORDER — SILDENAFIL CITRATE 50 MG PO TABS: 50.0000 mg | ORAL_TABLET | ORAL | 1 refills | Status: DC | PRN

## 2024-02-29 ENCOUNTER — Encounter: Payer: Self-pay | Admitting: Family

## 2024-03-01 MED ORDER — SILDENAFIL CITRATE 50 MG PO TABS: 50.0000 mg | ORAL_TABLET | ORAL | 1 refills | Status: DC | PRN

## 2024-03-18 ENCOUNTER — Encounter: Payer: Self-pay | Admitting: Family

## 2024-03-18 ENCOUNTER — Ambulatory Visit: Payer: Self-pay | Admitting: Family

## 2024-03-18 VITALS — BP 136/88 | HR 82 | Ht 72.0 in | Wt 285.2 lb

## 2024-03-18 DIAGNOSIS — E66813 Obesity, class 3: Secondary | ICD-10-CM

## 2024-03-18 DIAGNOSIS — E782 Mixed hyperlipidemia: Secondary | ICD-10-CM

## 2024-03-18 DIAGNOSIS — R799 Abnormal finding of blood chemistry, unspecified: Secondary | ICD-10-CM

## 2024-03-18 DIAGNOSIS — E291 Testicular hypofunction: Secondary | ICD-10-CM

## 2024-03-18 DIAGNOSIS — I10 Essential (primary) hypertension: Secondary | ICD-10-CM

## 2024-03-18 DIAGNOSIS — Z6841 Body Mass Index (BMI) 40.0 and over, adult: Secondary | ICD-10-CM

## 2024-03-18 LAB — CBC
Hematocrit: 53.1 % — ABNORMAL HIGH (ref 37.5–51.0)
Hemoglobin: 17.7 g/dL (ref 13.0–17.7)
MCH: 31.5 pg (ref 26.6–33.0)
MCHC: 33.3 g/dL (ref 31.5–35.7)
MCV: 95 fL (ref 79–97)
Platelets: 235 x10E3/uL (ref 150–450)
RBC: 5.62 x10E6/uL (ref 4.14–5.80)
RDW: 12.9 % (ref 11.6–15.4)
WBC: 9.6 x10E3/uL (ref 3.4–10.8)

## 2024-03-18 MED ORDER — SILDENAFIL CITRATE 50 MG PO TABS: 50.0000 mg | ORAL_TABLET | ORAL | 5 refills | Status: AC | PRN

## 2024-03-18 MED ORDER — TESTOSTERONE CYPIONATE 200 MG/ML IM SOLN: 200.0000 mg | INTRAMUSCULAR | 2 refills | Status: AC

## 2024-03-18 NOTE — Progress Notes (Signed)
 Established Patient Office Visit  Subjective:  Patient ID: BLESS BELSHE, male    DOB: June 14, 1980  Age: 43 y.o. MRN: [id number]  Chief Complaint  Patient presents with   Follow-up    6 month follow up    Patient is here today for his 6 months follow up.  He has been feeling fairly well since last appointment.   He does not have additional concerns to discuss today.   Labs are due today, however, he does not have insurance any longer, so he asks if we can defer.   His labs at last check were primarily WNL, so we will defer all but his CBC, which was abnormal at previous check.   He needs refills. Asks if we can send these to [facility] as they are cheaper than [facility].   I have reviewed his active problem list, medication list, allergies, notes from last encounter, lab results for his appointment today.      No other concerns at this time.   Past Medical History:  Diagnosis Date   Hypertension     Past Surgical History:  Procedure Laterality Date   APPENDECTOMY  1984   TONSILLECTOMY  1989    Social History   Socioeconomic History   Marital status: Single    Spouse name: Not on file   Number of children: Not on file   Years of education: Not on file   Highest education level: Not on file  Occupational History   Not on file  Tobacco Use   Smoking status: Former    Current packs/day: 0.00    Average packs/day: 2.0 packs/day for 25.0 years (50.0 ttl pk-yrs)    Types: Cigarettes    Start date: 12/29/1997    Quit date: 12/30/2022    Years since quitting: 1.2   Smokeless tobacco: Never  Substance and Sexual Activity   Alcohol use: Not Currently   Drug use: Not Currently    Types: Amphetamines    Comment: stopped actively using 2 years ago   Sexual activity: Yes  Other Topics Concern   Not on file  Social History Narrative   Not on file   Social Drivers of Health   Financial Resource Strain: Not on file  Food Insecurity: Not on file  Transportation Needs: Not on  file  Physical Activity: Not on file  Stress: Not on file  Social Connections: Not on file  Intimate Partner Violence: Not on file    Family History  Problem Relation Age of Onset   Cancer Mother 89       liver   Alcoholism Father    Cirrhosis Father    Other Sister        Shunt in brain    No Known Allergies  Review of Systems  All other systems reviewed and are negative.      Objective:   BP 136/88   Pulse 82   Ht 6' (1.829 m)   Wt 285 lb 3.2 oz (129.4 kg)   SpO2 96%   BMI 38.68 kg/m   Vitals:   03/18/24 0859  BP: 136/88  Pulse: 82  Height: 6' (1.829 m)  Weight: 285 lb 3.2 oz (129.4 kg)  SpO2: 96%  BMI (Calculated): 38.67    Physical Exam Vitals and nursing note reviewed.  Constitutional:      Appearance: Normal appearance. He is obese.  HENT:     Head: Normocephalic and atraumatic.     Right Ear: External ear normal.  Left Ear: External ear normal.     Nose: Nose normal.  Eyes:     Extraocular Movements: Extraocular movements intact.     Conjunctiva/sclera: Conjunctivae normal.     Pupils: Pupils are equal, round, and reactive to light.  Cardiovascular:     Rate and Rhythm: Normal rate and regular rhythm.     Pulses: Normal pulses.     Heart sounds: Normal heart sounds.  Pulmonary:     Effort: Pulmonary effort is normal.     Breath sounds: Normal breath sounds.  Musculoskeletal:        General: Normal range of motion.     Cervical back: Normal range of motion.  Skin:    General: Skin is warm and dry.  Neurological:     General: No focal deficit present.     Mental Status: He is alert and oriented to person, place, and time. Mental status is at baseline.  Psychiatric:        Mood and Affect: Mood normal.        Behavior: Behavior normal.        Thought Content: Thought content normal.        Judgment: Judgment normal.      No results found for any visits on 03/18/24.  No results found for this or any previous visit (from the  past 2160 hours).     Assessment & Plan Abnormal blood chemistry Rechecking CBC today.  Will call with results when available.   Class 3 severe obesity due to excess calories with serious comorbidity and body mass index (BMI) of 40.0 to 44.9 in adult [facility]) Continue current meds.  Will adjust as needed based on results.  The patient is asked to make an attempt to improve diet and exercise patterns to aid in medical management of this problem. Addressed importance of increasing and maintaining water intake.   Mixed hyperlipidemia Continue current therapy for lipid control. Will modify as needed based on labwork results.   Testicular hypofunction Patient stable.  Well controlled with current therapy.   Continue current meds.   Primary hypertension Blood pressure well controlled with current medications.  Continue current therapy.  Will reassess at follow up.       Return in about 6 months (around 09/15/2024).   Total time spent: 20 minutes  ALAN CHRISTELLA ARRANT, FNP  03/18/2024   This document may have been prepared by [facility] Voice Recognition software and as such may include unintentional dictation errors.

## 2024-03-18 NOTE — Assessment & Plan Note (Signed)
 Continue current therapy for lipid control. Will modify as needed based on labwork results.

## 2024-03-18 NOTE — Assessment & Plan Note (Signed)
 Blood pressure well controlled with current medications.  Continue current therapy.  Will reassess at follow up.

## 2024-03-18 NOTE — Assessment & Plan Note (Signed)
 Continue current meds.  Will adjust as needed based on results.  The patient is asked to make an attempt to improve diet and exercise patterns to aid in medical management of this problem. Addressed importance of increasing and maintaining water  intake.

## 2024-03-18 NOTE — Assessment & Plan Note (Signed)
 Patient stable.  Well controlled with current therapy.   Continue current meds.

## 2024-03-21 ENCOUNTER — Ambulatory Visit: Payer: Self-pay

## 2024-03-22 ENCOUNTER — Encounter: Payer: Self-pay | Admitting: Family

## 2024-09-16 ENCOUNTER — Ambulatory Visit: Payer: Self-pay | Admitting: Family
